# Patient Record
Sex: Male | Born: 1995 | Race: Black or African American | Hispanic: No | Marital: Single | Smoking: Never smoker
Health system: Southern US, Community
[De-identification: ages and names within clinical notes are randomized; demographics above are authoritative.]

## PROBLEM LIST (undated history)

## (undated) DIAGNOSIS — J45909 Unspecified asthma, uncomplicated: Secondary | ICD-10-CM

## (undated) DIAGNOSIS — J309 Allergic rhinitis, unspecified: Secondary | ICD-10-CM

## (undated) DIAGNOSIS — R519 Headache, unspecified: Secondary | ICD-10-CM

## (undated) DIAGNOSIS — R51 Headache: Secondary | ICD-10-CM

---
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
## 2018-12-12 ENCOUNTER — Ambulatory Visit
Admission: EM | Admit: 2018-12-12 | Discharge: 2018-12-12 | Disposition: A | Discharge status: Other Acute Inpatient Hospital | Payer: Self-pay

## 2019-12-22 ENCOUNTER — Ambulatory Visit: Payer: Self-pay | Admitting: Family Medicine

## 2020-10-22 ENCOUNTER — Ambulatory Visit
Admission: EM | Admit: 2020-10-22 | Discharge: 2020-10-22 | Disposition: A | Discharge status: Home / Self Care | Payer: BC Managed Care – PPO | Attending: Family Medicine | Admitting: Family Medicine

## 2020-10-22 ENCOUNTER — Encounter: Payer: Self-pay | Admitting: Emergency Medicine

## 2020-10-22 ENCOUNTER — Other Ambulatory Visit: Payer: Self-pay

## 2020-10-22 DIAGNOSIS — K4090 Unilateral inguinal hernia, without obstruction or gangrene, not specified as recurrent: Secondary | ICD-10-CM | POA: Insufficient documentation

## 2020-10-22 LAB — URINALYSIS, COMPLETE (UACMP) WITH MICROSCOPIC
Bacteria, UA: NONE SEEN
Bilirubin Urine: NEGATIVE
Glucose, UA: NEGATIVE mg/dL
Hgb urine dipstick: NEGATIVE
Ketones, ur: NEGATIVE mg/dL
Leukocytes,Ua: NEGATIVE
Nitrite: NEGATIVE
Protein, ur: NEGATIVE mg/dL
RBC / HPF: NONE SEEN RBC/hpf (ref 0–5)
Specific Gravity, Urine: 1.025 (ref 1.005–1.030)
Squamous Epithelial / LPF: NONE SEEN (ref 0–5)
pH: 6 (ref 5.0–8.0)

## 2020-10-22 MED ORDER — MELOXICAM 15 MG PO TABS: 15.0000 mg | ORAL_TABLET | Freq: Every day | ORAL | 0 refills | Status: DC | PRN

## 2020-10-22 NOTE — ED Provider Notes (Signed)
[facility]    CSN: [id number] Arrival date & time: 10/22/20  0859      History   Chief Complaint Chief Complaint  Patient presents with   Hip Pain   Abdominal Pain    left   HPI  25 year old male presents with the above complaints.  Patient reports that he has had left lateral hip pain for the past 2 weeks.  He states that it occurred after he was playing basketball.  Patient reports that he has applied ice With improvement.  Patient reports that over the past 2 to 3 days he has noticed some pain in his groin and has a bulge in the area.  He states that this was initially noticed by his girlfriend.  He believes he has a hernia.  He states that he lifts heavy objects at work.  He is a [employer].  Pain currently 6/10 in severity.  No relieving factors.  No nausea, vomiting,, fever.  No other complaints this time  Past Medical History:  Diagnosis Date   Allergic rhinitis    Asthma    Headache    Home Medications    Prior to Admission medications   Medication Sig Start Date End Date Taking? Authorizing Provider  meloxicam (MOBIC) 15 MG tablet Take 1 tablet (15 mg total) by mouth daily as needed. 10/22/20  Yes Shanikka Wonders G, DO  cetirizine (ZYRTEC) 10 MG tablet Take 10 mg by mouth daily.    [provider]  albuterol (PROVENTIL HFA;VENTOLIN HFA) 108 (90 BASE) MCG/ACT inhaler Inhale 2 puffs into the lungs every 4 (four) hours as needed for wheezing or shortness of breath. 06/27/15 10/22/20  Renford Dills, NP    Family History Family History  Problem Relation Age of Onset   Diabetes Mother    Diabetes Father     Social History Social History   Tobacco Use   Smoking status: Never Smoker   Smokeless tobacco: Never Used  Substance Use Topics   Alcohol use: No   Drug use: No     Allergies   Patient has no known allergies.   Review of Systems Review of Systems Per HPI Physical Exam Triage Vital Signs ED Triage Vitals  Enc  Vitals Group     BP 10/22/20 0941 136/68     Pulse Rate 10/22/20 0941 76     Resp 10/22/20 0941 18     Temp 10/22/20 0941 98.5 F (36.9 C)     Temp Source 10/22/20 0941 Oral     SpO2 10/22/20 0941 100 %     Weight 10/22/20 0938 193 lb (87.5 kg)     Height 10/22/20 0938 6\' 3"  (1.905 m)     Head Circumference --      Peak Flow --      Pain Score 10/22/20 1026 5     Pain Loc --      Pain Edu? --      Excl. in GC? --    Updated Vital Signs BP 136/68 (BP Location: Left Arm)    Pulse 76    Temp 98.5 F (36.9 C) (Oral)    Resp 18    Ht 6\' 3"  (1.905 m)    Wt 87.5 kg    SpO2 100%    BMI 24.12 kg/m   Visual Acuity Right Eye Distance:   Left Eye Distance:   Bilateral Distance:    Right Eye Near:   Left Eye Near:    Bilateral Near:  Physical Exam Vitals and nursing note reviewed.  Constitutional:      General: He is not in acute distress.    Appearance: Normal appearance. He is not ill-appearing.  HENT:     Head: Normocephalic and atraumatic.  Eyes:     General:        Right eye: No discharge.        Left eye: No discharge.     Conjunctiva/sclera: Conjunctivae normal.  Cardiovascular:     Rate and Rhythm: Normal rate and regular rhythm.     Heart sounds: No murmur heard.   Pulmonary:     Effort: Pulmonary effort is normal.     Breath sounds: No wheezing, rhonchi or rales.  Abdominal:     Hernia: A hernia is present. Hernia is present in the left inguinal area.  Genitourinary:    Comments: Left inguinal hernia noted.  Was easily reduced today. Neurological:     Mental Status: He is alert.  Psychiatric:        Mood and Affect: Mood normal.        Behavior: Behavior normal.    UC Treatments / Results  Labs (all labs ordered are listed, but only abnormal results are displayed) Labs Reviewed  URINALYSIS, COMPLETE ([facility]) WITH MICROSCOPIC    EKG   Radiology No results found.  Procedures Procedures (including critical care time)  Medications Ordered in  UC Medications - No data to display  Initial Impression / Assessment and Plan / UC Course  I have reviewed the triage vital signs and the nursing notes.  Pertinent labs & imaging results that were available during my care of the patient were reviewed by me and considered in my medical decision making (see chart for details).    25 year old male presents with a reducible left inguinal hernia.  Meloxicam as needed for pain.  Advised to avoid heavy lifting.  Work note given.  Referral placed to general surgery.  Final Clinical Impressions(s) / UC Diagnoses   Final diagnoses:  Left inguinal hernia     Discharge Instructions     No lifting > 10 lbs.  Medication as prescribed.  I have referred you to General surgery.  Take care  Dr. Adriana Simas    ED Prescriptions    Medication Sig Dispense Auth. Provider   meloxicam (MOBIC) 15 MG tablet Take 1 tablet (15 mg total) by mouth daily as needed. 30 tablet Tommie Sams, DO     PDMP not reviewed this encounter.   Tommie Sams, [place] 10/22/20 1124

## 2020-10-22 NOTE — ED Triage Notes (Signed)
Pt presents with left hip and lower left abdominal pain x 2.5 weeks. He is concerned he may have a hernia. 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 Pain 6/10. Applies Icy hot to area.

## 2020-10-22 NOTE — Discharge Instructions (Signed)
No lifting > 10 lbs.  Medication as prescribed.  I have referred you to General surgery.  Take care  Dr. Adriana Simas

## 2020-10-27 ENCOUNTER — Ambulatory Visit (INDEPENDENT_AMBULATORY_CARE_PROVIDER_SITE_OTHER): Payer: BC Managed Care – PPO | Admitting: Surgery

## 2020-10-27 ENCOUNTER — Encounter: Payer: Self-pay | Admitting: Surgery

## 2020-10-27 ENCOUNTER — Other Ambulatory Visit: Payer: Self-pay

## 2020-10-27 VITALS — BP 145/80 | HR 71 | Temp 98.3°F | Ht 75.0 in | Wt 195.2 lb

## 2020-10-27 DIAGNOSIS — K4090 Unilateral inguinal hernia, without obstruction or gangrene, not specified as recurrent: Secondary | ICD-10-CM | POA: Diagnosis not present

## 2020-10-27 NOTE — Patient Instructions (Addendum)
Our surgery scheduler Britta Mccreedy will contact you within the next 24-48 hours to discuss the preparation prior to surgery and also discuss the times to align with your schedule. Please have the BLUE sheet when she contacts you. If you have any questions or concerns, please do not hesitate to give our office a call. Dr Aleen Campi discussed with patient the surgical treatment, risk factors and recovery phase from surgery at today's visit. Patient advised to avoid any heavy lifting, bending, pushing and pulling for a total of 4-6 weeks after surgery.  Inguinal Hernia, Adult An inguinal hernia develops when fat or the intestines push through a weak spot in a muscle where the leg meets the lower abdomen (groin). This creates a bulge. This kind of hernia could also be:  In the scrotum, if you are male.  In folds of skin around the vagina, if you are male. There are three types of inguinal hernias:  Hernias that can be pushed back into the abdomen (are reducible). This type rarely causes pain.  Hernias that are not reducible (are incarcerated).  Hernias that are not reducible and lose their blood supply (are strangulated). This type of hernia requires emergency surgery. What are the causes? This condition is caused by having a weak spot in the muscles or tissues in your groin. This develops over time. The hernia may poke through the weak spot when you suddenly strain your lower abdominal muscles, such as when you:  Lift a heavy object.  Strain to have a bowel movement. Constipation can lead to straining.  Cough. What increases the risk? This condition is more likely to develop in:  Males.  Pregnant females.  People who: ? Are overweight. ? Work in jobs that require long periods of standing or heavy lifting. ? Have had an inguinal hernia before. ? Smoke or have lung disease. These factors can lead to long-term (chronic) coughing. What are the signs or symptoms? Symptoms may depend on the  size of the hernia. Often, a small inguinal hernia has no symptoms. Symptoms of a larger hernia may include:  A bulge in the groin area. This is easier to see when standing. It might not be visible when lying down.  Pain or burning in the groin. This may get worse when lifting, straining, or coughing.  A dull ache or a feeling of pressure in the groin.  An unusual bulge in the scrotum, in males. Symptoms of a strangulated inguinal hernia may include:  A bulge in your groin that is very painful and tender to the touch.  A bulge that turns red or purple.  Fever, nausea, and vomiting.  Inability to have a bowel movement or to pass gas. How is this diagnosed? This condition is diagnosed based on your symptoms, your medical history, and a physical exam. Your health care provider may feel your groin area and ask you to cough. How is this treated? Treatment depends on the size of your hernia and whether you have symptoms. If you do not have symptoms, your health care provider may have you watch your hernia carefully and have you come in for follow-up visits. If your hernia is large or if you have symptoms, you may need surgery to repair the hernia. Follow these instructions at home: Lifestyle  Avoid lifting heavy objects.  Avoid standing for long periods of time.  Do not use any products that contain nicotine or tobacco. These products include cigarettes, chewing tobacco, and vaping devices, such as e-cigarettes. If you need  help quitting, ask your health care provider.  Maintain a healthy weight. Preventing constipation You may need to take these actions to prevent or treat constipation:  Drink enough fluid to keep your urine pale yellow.  Take over-the-counter or prescription medicines.  Eat foods that are high in fiber, such as beans, whole grains, and fresh fruits and vegetables.  Limit foods that are high in fat and processed sugars, such as fried or sweet foods. General  instructions  You may try to push the hernia back in place by very gently pressing on it while lying down. Do not try to force the bulge back in if it will not push in easily.  Watch your hernia for any changes in shape, size, or color. Get help right away if you notice any changes.  Take over-the-counter and prescription medicines only as told by your health care provider.  Keep all follow-up visits. This is important. Contact a health care provider if:  You have a fever or chills.  You develop new symptoms.  Your symptoms get worse. Get help right away if:  You have pain in your groin that suddenly gets worse.  You have a bulge in your groin that: ? Suddenly gets bigger and does not get smaller. ? Becomes red or purple or painful to the touch.  You are a man and you have a sudden pain in your scrotum, or the size of your scrotum suddenly changes.  You cannot push the hernia back in place by very gently pressing on it when you are lying down.  You have nausea or vomiting that does not go away.  You have a fast heartbeat.  You cannot have a bowel movement or pass gas. These symptoms may represent a serious problem that is an emergency. Do not wait to see if the symptoms will go away. Get medical help right away. Call your local emergency services (911 in the [place] Summary  An inguinal hernia develops when fat or the intestines push through a weak spot in a muscle where your leg meets your lower abdomen (groin).  This condition is caused by having a weak spot in muscles or tissues in your groin.  Symptoms may depend on the size of the hernia, and they may include pain or swelling in your groin. A small inguinal hernia often has no symptoms.  Treatment may not be needed if you do not have symptoms. If you have symptoms or a large hernia, you may need surgery to repair the hernia.  Avoid lifting heavy objects. Also, avoid standing for long periods of time. This information  is not intended to replace advice given to you by your health care provider. Make sure you discuss any questions you have with your health care provider. Document Revised: 05/18/2020 Document Reviewed: 05/18/2020 Elsevier Patient Education  2021 [employer]  Laparoscopic Inguinal Hernia Repair, Adult Laparoscopic inguinal hernia repair is a surgical procedure to repair a small, weak spot in the groin muscles that allows fat or intestines from inside the abdomen to bulge out (inguinal hernia). This procedure may be planned, or it may be an emergency procedure. During the procedure, tissue that has bulged out is moved back into place, and the opening in the groin muscles is repaired. This is done through three small incisions in the abdomen. A thin tube with a light and camera on the end (laparoscope) is used to help perform the procedure. Tell a health care provider about:  Any allergies you have.  All medicines you are taking, including vitamins, herbs, eye drops, creams, and over-the-counter medicines.  Any problems you or family members have had with anesthetic medicines.  Any blood disorders you have.  Any surgeries you have had.  Any medical conditions you have.  Whether you are pregnant or may be pregnant. What are the risks? Generally, this is a safe procedure. However, problems may occur, including:  Infection.  Bleeding.  Allergic reactions to medicines.  Damage to nearby structures or organs.  Testicle damage or long-term pain and swelling of the scrotum, in males.  Inability to completely empty the bladder (urinary retention).  Blood clots.  A collection of fluid that builds up under the skin (seroma).  The hernia coming back (recurrence). What happens before the procedure? Staying hydrated Follow instructions from your health care provider about hydration, which may include:  Up to 2 hours before the procedure - you may continue to drink clear liquids, such  as water, clear fruit juice, black coffee, and plain tea.   Eating and drinking restrictions Follow instructions from your health care provider about eating and drinking, which may include:  8 hours before the procedure - stop eating heavy meals or foods, such as meat, fried foods, or fatty foods.  6 hours before the procedure - stop eating light meals or foods, such as toast or cereal.  6 hours before the procedure - stop drinking milk or drinks that contain milk.  2 hours before the procedure - stop drinking clear liquids. Medicines Ask your health care provider about:  Changing or stopping your regular medicines. This is especially important if you are taking diabetes medicines or blood thinners.  Taking medicines such as aspirin and ibuprofen. These medicines can thin your blood. Do not take these medicines unless your health care provider tells you to take them.  Taking over-the-counter medicines, vitamins, herbs, and supplements. General instructions  Do not use any products that contain nicotine or tobacco for at least 4 weeks before the procedure, if possible. These products include cigarettes, chewing tobacco, and vaping devices, such as e-cigarettes. If you need help quitting, ask your health care provider.  Ask your health care provider: ? How your surgery site will be marked. ? What steps will be taken to help prevent infection. These steps may include:  Removing hair at the surgery site.  Washing skin with a germ-killing soap.  Taking antibiotic medicine.  Plan to have a responsible adult take you home from the hospital or clinic.  Plan to have a responsible adult care for you for the time you are told after you leave the hospital or clinic. This is important. What happens during the procedure?  An IV will be inserted into one of your veins.  You will be given one or more of the following: ? A medicine to help you relax (sedative). ? A medicine to make you fall  asleep (general anesthetic).  Three small incisions will be made in your abdomen.  Your abdomen will be inflated with carbon dioxide gas to make the surgical area easier to see.  A laparoscope and surgical instruments will be inserted through the incisions. The laparoscope will send images of the inside of your abdomen to a monitor in the room.  Tissue that is bulging through the hernia may be removed or moved back into place.  The hernia opening will be closed with a sheet of surgical mesh.  The surgical instruments and laparoscope will be removed.  Your incisions will  be closed with stitches (sutures) and adhesive strips.  A bandage (dressing) will be placed over your incisions. The procedure may vary among health care providers and hospitals. What happens after the procedure?  Your blood pressure, heart rate, breathing rate, and blood oxygen level will be monitored until you leave the hospital or clinic.  You will be given pain medicine as needed.  You may continue to receive medicines and fluids through an IV. The IV will be removed after you can drink fluids.  You will be encouraged to get up and move around and to take deep breaths frequently.  If you were given a sedative during the procedure, it can affect you for several hours. Do not drive or operate machinery until your health care provider says that it is safe. Summary  Laparoscopic inguinal hernia repair is a surgical procedure to repair a small, weak spot in the groin muscles that allows fat or intestines from inside the abdomen to bulge out (inguinal hernia).  This procedure is done through three small incisions in the abdomen. A thin tube with a light and camera on the end (laparoscope) is used to help perform the procedure.  After the procedure, you will be encouraged to get up and move around and to take deep breaths frequently. This information is not intended to replace advice given to you by your health care  provider. Make sure you discuss any questions you have with your health care provider. Document Revised: 05/18/2020 Document Reviewed: 05/18/2020 Elsevier Patient Education  2021 [employer].

## 2020-10-27 NOTE — Progress Notes (Signed)
10/27/2020  Reason for Visit:  Left inguinal hernia  History of Present Illness: Allen Day is a 25 y.o. male presenting for evaluation of a left inguinal hernia.  The patient presented to Urgent Care on 1/21 with complaints of bulging on the left groin.  The patient reports that he does some heavy lifting at work sometimes and has noticed the discomfort in the left groin particularly when trying to push the bulge back inside.  He also notices discomfort when he's been playing basketball.  Denies any worsening pain and reports the bulging is able to be pushed inside.  Denies any troubles with urination or bowel movements, and denies any obstructive symptoms.  The discomfort is localized to the left groin and does not radiate.  Denies any issues on the right groin.  Past Medical History: Past Medical History:  Diagnosis Date  . Allergic rhinitis   . Asthma   . Headache      Past Surgical History: --No prior surgeries.  Home Medications: Prior to Admission medications   Medication Sig Start Date End Date Taking? Authorizing Provider  cetirizine (ZYRTEC) 10 MG tablet Take 10 mg by mouth daily.   Yes [provider]  meloxicam (MOBIC) 15 MG tablet Take 1 tablet (15 mg total) by mouth daily as needed. 10/22/20  Yes Cook, Jayce G, DO  albuterol (PROVENTIL HFA;VENTOLIN HFA) 108 (90 BASE) MCG/ACT inhaler Inhale 2 puffs into the lungs every 4 (four) hours as needed for wheezing or shortness of breath. 06/27/15 10/22/20  Renford Dills, NP    Allergies: No Known Allergies  Social History:  reports that he has never smoked. He has never used smokeless tobacco. He reports that he does not drink alcohol and does not use drugs.   Family History: Family History  Problem Relation Age of Onset  . Diabetes Mother   . Diabetes Father     Review of Systems: Review of Systems  Constitutional: Negative for chills and fever.  HENT: Negative for hearing loss.   Respiratory: Negative  for shortness of breath.   Cardiovascular: Negative for chest pain.  Gastrointestinal: Positive for abdominal pain (left groin discomfort.). Negative for nausea and vomiting.  Genitourinary: Negative for dysuria.  Musculoskeletal: Negative for myalgias.  Skin: Negative for rash.  Neurological: Negative for dizziness.  Psychiatric/Behavioral: Negative for depression.    Physical Exam BP (!) 145/80   Pulse 71   Temp 98.3 F (36.8 C) (Oral)   Ht 6\' 3"  (1.905 m)   Wt 195 lb 3.2 oz (88.5 kg)   SpO2 98%   BMI 24.40 kg/m  CONSTITUTIONAL: No acute distress HEENT:  Normocephalic, atraumatic, extraocular motion intact. NECK: Trachea is midline, and there is no jugular venous distension.  RESPIRATORY: Normal respiratory effort without pathologic use of accessory muscles. CARDIOVASCULAR: Regular rhythm and rate. GI: The abdomen is soft, non-distended, non-tender to palpation.  When standing up, the patient has a noticeable bulge in the left groin consistent with a left inguinal hernia, without any defects on the right groin.  When supine, the hernia easily reduces on its own.  No palpable hernia on the right or on the umbillicus.  MUSCULOSKELETAL:  Normal muscle strength and tone in all four extremities.  No peripheral edema or cyanosis. SKIN: Skin turgor is normal. There are no pathologic skin lesions.  NEUROLOGIC:  Motor and sensation is grossly normal.  Cranial nerves are grossly intact. PSYCH:  Alert and oriented to person, place and time. Affect is normal.  Laboratory Analysis: No results found for this or any previous visit (from the past 24 hour(s)).  Imaging: No results found.  Assessment and Plan: This is a 25 y.o. male with a left inguinal hernia.  --Discussed with the patient the types of inguinal hernias ranging from reducible, which is his, to incarcerated and strangulated.  Discussed that there is no conservative measure such as exercises or medications that he could take to  reduce the size of the hernia or repair it.  I think he would be a good candidate for robotic approach to hernia repair.  Discussed with him the risks of bleeding, infection, injury to surrounding structures, the ability to evaluate the right groin and repair any hernia on the right side in the same setting, the recovery time, post-op pain and mobility, and activity restrictions, and he's willing to proceed. --We will schedule him for robotic assisted left inguinal hernia repair on 11/11/20.  He understands he will need COVID-19 testing prior to surgery.  Face-to-face time spent with the patient and care providers was 45 minutes, with more than 50% of the time spent counseling, educating, and coordinating care of the patient.     Cobi Delph Luis Khamora Karan, MD [facility]   

## 2020-10-27 NOTE — H&P (View-Only) (Signed)
10/27/2020  Reason for Visit:  Left inguinal hernia  History of Present Illness: Allen Day is a 25 y.o. male presenting for evaluation of a left inguinal hernia.  The patient presented to Urgent Care on 1/21 with complaints of bulging on the left groin.  The patient reports that he does some heavy lifting at work sometimes and has noticed the discomfort in the left groin particularly when trying to push the bulge back inside.  He also notices discomfort when he's been playing basketball.  Denies any worsening pain and reports the bulging is able to be pushed inside.  Denies any troubles with urination or bowel movements, and denies any obstructive symptoms.  The discomfort is localized to the left groin and does not radiate.  Denies any issues on the right groin.  Past Medical History: Past Medical History:  Diagnosis Date  . Allergic rhinitis   . Asthma   . Headache      Past Surgical History: --No prior surgeries.  Home Medications: Prior to Admission medications   Medication Sig Start Date End Date Taking? Authorizing Provider  cetirizine (ZYRTEC) 10 MG tablet Take 10 mg by mouth daily.   Yes [provider]  meloxicam (MOBIC) 15 MG tablet Take 1 tablet (15 mg total) by mouth daily as needed. 10/22/20  Yes Cook, Jayce G, DO  albuterol (PROVENTIL HFA;VENTOLIN HFA) 108 (90 BASE) MCG/ACT inhaler Inhale 2 puffs into the lungs every 4 (four) hours as needed for wheezing or shortness of breath. 06/27/15 10/22/20  Renford Dills, NP    Allergies: No Known Allergies  Social History:  reports that he has never smoked. He has never used smokeless tobacco. He reports that he does not drink alcohol and does not use drugs.   Family History: Family History  Problem Relation Age of Onset  . Diabetes Mother   . Diabetes Father     Review of Systems: Review of Systems  Constitutional: Negative for chills and fever.  HENT: Negative for hearing loss.   Respiratory: Negative  for shortness of breath.   Cardiovascular: Negative for chest pain.  Gastrointestinal: Positive for abdominal pain (left groin discomfort.). Negative for nausea and vomiting.  Genitourinary: Negative for dysuria.  Musculoskeletal: Negative for myalgias.  Skin: Negative for rash.  Neurological: Negative for dizziness.  Psychiatric/Behavioral: Negative for depression.    Physical Exam BP (!) 145/80   Pulse 71   Temp 98.3 F (36.8 C) (Oral)   Ht 6\' 3"  (1.905 m)   Wt 195 lb 3.2 oz (88.5 kg)   SpO2 98%   BMI 24.40 kg/m  CONSTITUTIONAL: No acute distress HEENT:  Normocephalic, atraumatic, extraocular motion intact. NECK: Trachea is midline, and there is no jugular venous distension.  RESPIRATORY: Normal respiratory effort without pathologic use of accessory muscles. CARDIOVASCULAR: Regular rhythm and rate. GI: The abdomen is soft, non-distended, non-tender to palpation.  When standing up, the patient has a noticeable bulge in the left groin consistent with a left inguinal hernia, without any defects on the right groin.  When supine, the hernia easily reduces on its own.  No palpable hernia on the right or on the umbillicus.  MUSCULOSKELETAL:  Normal muscle strength and tone in all four extremities.  No peripheral edema or cyanosis. SKIN: Skin turgor is normal. There are no pathologic skin lesions.  NEUROLOGIC:  Motor and sensation is grossly normal.  Cranial nerves are grossly intact. PSYCH:  Alert and oriented to person, place and time. Affect is normal.  Laboratory Analysis: No results found for this or any previous visit (from the past 24 hour(s)).  Imaging: No results found.  Assessment and Plan: This is a 25 y.o. male with a left inguinal hernia.  --Discussed with the patient the types of inguinal hernias ranging from reducible, which is his, to incarcerated and strangulated.  Discussed that there is no conservative measure such as exercises or medications that he could take to  reduce the size of the hernia or repair it.  I think he would be a good candidate for robotic approach to hernia repair.  Discussed with him the risks of bleeding, infection, injury to surrounding structures, the ability to evaluate the right groin and repair any hernia on the right side in the same setting, the recovery time, post-op pain and mobility, and activity restrictions, and he's willing to proceed. --We will schedule him for robotic assisted left inguinal hernia repair on 11/11/20.  He understands he will need COVID-19 testing prior to surgery.  Face-to-face time spent with the patient and care providers was 45 minutes, with more than 50% of the time spent counseling, educating, and coordinating care of the patient.     Howie Ill, MD [facility]

## 2020-10-28 ENCOUNTER — Telehealth: Payer: Self-pay | Admitting: Surgery

## 2020-10-28 NOTE — Telephone Encounter (Signed)
Patient has been advised of Pre-Admission date/time, COVID Testing date and Surgery date.  Surgery Date: 11/11/20 Preadmission Testing Date: 11/02/20 (phone 8a-1p) Covid Testing Date: 11/09/20 - patient advised to [facility] ([address] [facility]) between 8a-1p   Patient has been made aware to call [phone], between 1-3:00pm the day before surgery, to find out what time to arrive for surgery.

## 2020-11-02 ENCOUNTER — Encounter
Admission: RE | Admit: 2020-11-02 | Discharge: 2020-11-02 | Disposition: A | Discharge status: Home / Self Care | Payer: BC Managed Care – PPO | Source: Ambulatory Visit | Attending: Surgery | Admitting: Surgery

## 2020-11-02 ENCOUNTER — Other Ambulatory Visit: Payer: Self-pay

## 2020-11-02 NOTE — Patient Instructions (Addendum)
Your procedure is scheduled on: Thursday, February 10 Report to the Registration Desk on the 1st floor of the [facility] To find out your arrival time, please call [phone] between 1PM - 3PM on: Wednesday, February 9  REMEMBER: Instructions that are not followed completely may result in serious medical risk, up to and including death; or upon the discretion of your surgeon and anesthesiologist your surgery may need to be rescheduled.  Do not eat food after midnight the night before surgery.  No gum chewing, lozengers or hard candies.  You may however, drink CLEAR liquids up to 2 hours before you are scheduled to arrive for your surgery. Do not drink anything within 2 hours of your scheduled arrival time.  Clear liquids include: - water  - apple juice without pulp - gatorade (not RED, PURPLE, OR BLUE) - black coffee or tea (Do NOT add milk or creamers to the coffee or tea) Do NOT drink anything that is not on this list.  DO NOT TAKE ANY MEDICATIONS THE MORNING OF SURGERY  One week prior to surgery: STARTING February 3 Stop MELOXICAM, Anti-inflammatories (NSAIDS) such as Advil, Aleve, Ibuprofen, Motrin, Naproxen, Naprosyn and Aspirin based products such as Excedrin, Goodys Powder, BC Powder. Stop ANY OVER THE COUNTER supplements until after surgery.  No Alcohol for 24 hours before or after surgery.  No Smoking including e-cigarettes for 24 hours prior to surgery.  No chewable tobacco products for at least 6 hours prior to surgery.  No nicotine patches on the day of surgery.  Do not use any "recreational" drugs for at least a week prior to your surgery.  Please be advised that the combination of cocaine and anesthesia may have negative outcomes, up to and including death. If you test positive for cocaine, your surgery will be cancelled.  On the morning of surgery brush your teeth with toothpaste and water, you may rinse your mouth with mouthwash if you wish. Do not swallow  any toothpaste or mouthwash.  Do not wear jewelry, make-up, hairpins, clips or nail polish.  Do not wear lotions, powders, or perfumes.   Do not shave body from the neck down 48 hours prior to surgery just in case you cut yourself which could leave a site for infection.  Also, freshly shaved skin may become irritated if using the CHG soap.  Contact lenses, hearing aids and dentures may not be worn into surgery.  Do not bring valuables to the hospital. [facility] is not responsible for any missing/lost belongings or valuables.   Use CHG Soap as directed on instruction sheet.  Notify your doctor if there is any change in your medical condition (cold, fever, infection).  Wear comfortable clothing (specific to your surgery type) to the hospital.  Plan for stool softeners for home use; pain medications have a tendency to cause constipation. You can also help prevent constipation by eating foods high in fiber such as fruits and vegetables and drinking plenty of fluids as your diet allows.  After surgery, you can help prevent lung complications by doing breathing exercises.  Take deep breaths and cough every 1-2 hours. Your doctor may order a device called an Incentive Spirometer to help you take deep breaths. When coughing or sneezing, hold a pillow firmly against your incision with both hands. This is called "splinting." Doing this helps protect your incision. It also decreases belly discomfort.  If you are being discharged the day of surgery, you will not be allowed to drive home. You  will need a responsible adult (18 years or older) to drive you home and stay with you that night.   If you are taking public transportation, you will need to have a responsible adult (18 years or older) with you. Please confirm with your physician that it is acceptable to use public transportation.   Please call the Pre-admissions Testing Dept. at [phone] if you have any questions about these  instructions.  Visitation Policy:  Patients undergoing a surgery or procedure may have one family member or support person with them as long as that person is not COVID-19 positive or experiencing its symptoms.  That person may remain in the waiting area during the procedure.

## 2020-11-08 ENCOUNTER — Telehealth: Payer: Self-pay | Admitting: Surgery

## 2020-11-08 NOTE — Telephone Encounter (Signed)
Pt called in an attempt to follow up on FMLA/STD leave paperwork that needs to be completed for upcoming surgery.  He states when he spoke w/someone last week, she indicated it would be done by Monday, if not sooner.  The pt is aware that there are no notations that the forms have been completed yet; however, a msg will be routed to her for verification. He is asking to be contacted @ [phone] w/a status update.  Thank you

## 2020-11-09 ENCOUNTER — Other Ambulatory Visit
Admission: RE | Admit: 2020-11-09 | Discharge: 2020-11-09 | Disposition: A | Discharge status: Home / Self Care | Payer: BC Managed Care – PPO | Source: Ambulatory Visit | Attending: Surgery | Admitting: Surgery

## 2020-11-09 ENCOUNTER — Telehealth: Payer: Self-pay | Admitting: *Deleted

## 2020-11-09 DIAGNOSIS — Z20822 Contact with and (suspected) exposure to covid-19: Secondary | ICD-10-CM | POA: Insufficient documentation

## 2020-11-09 DIAGNOSIS — Z01812 Encounter for preprocedural laboratory examination: Secondary | ICD-10-CM | POA: Insufficient documentation

## 2020-11-09 DIAGNOSIS — K4090 Unilateral inguinal hernia, without obstruction or gangrene, not specified as recurrent: Secondary | ICD-10-CM | POA: Diagnosis not present

## 2020-11-09 NOTE — Telephone Encounter (Signed)
Faxed FMLA to [employer] at [phone]

## 2020-11-09 NOTE — Telephone Encounter (Signed)
FMLA is completed and faxed, left message to let patient know.

## 2020-11-10 ENCOUNTER — Other Ambulatory Visit: Payer: Self-pay

## 2020-11-10 LAB — SARS CORONAVIRUS 2 (TAT 6-24 HRS): SARS Coronavirus 2: NEGATIVE

## 2020-11-11 ENCOUNTER — Ambulatory Visit
Admission: RE | Admit: 2020-11-11 | Discharge: 2020-11-11 | Disposition: A | Discharge status: Home / Self Care | Payer: BC Managed Care – PPO | Attending: Surgery | Admitting: Surgery

## 2020-11-11 ENCOUNTER — Ambulatory Visit: Admit: 2020-11-11 | Payer: BC Managed Care – PPO | Admitting: Surgery

## 2020-11-11 ENCOUNTER — Encounter
Admission: RE | Disposition: A | Discharge status: Home / Self Care | Payer: Self-pay | Source: Home / Self Care | Attending: Surgery

## 2020-11-11 ENCOUNTER — Ambulatory Visit: Payer: BC Managed Care – PPO | Admitting: Anesthesiology

## 2020-11-11 ENCOUNTER — Encounter: Payer: Self-pay | Admitting: Surgery

## 2020-11-11 ENCOUNTER — Ambulatory Visit: Payer: BC Managed Care – PPO

## 2020-11-11 ENCOUNTER — Other Ambulatory Visit: Payer: Self-pay

## 2020-11-11 ENCOUNTER — Other Ambulatory Visit: Payer: Self-pay | Admitting: Family Medicine

## 2020-11-11 DIAGNOSIS — Z20822 Contact with and (suspected) exposure to covid-19: Secondary | ICD-10-CM | POA: Insufficient documentation

## 2020-11-11 DIAGNOSIS — K4090 Unilateral inguinal hernia, without obstruction or gangrene, not specified as recurrent: Secondary | ICD-10-CM

## 2020-11-11 HISTORY — PX: XI ROBOTIC ASSISTED INGUINAL HERNIA REPAIR WITH MESH: SHX6706

## 2020-11-11 SURGERY — REPAIR, HERNIA, INGUINAL, ROBOT-ASSISTED, LAPAROSCOPIC, USING MESH
Anesthesia: General | Laterality: Left

## 2020-11-11 SURGERY — REPAIR, HERNIA, INGUINAL, ROBOT-ASSISTED, LAPAROSCOPIC, USING MESH
Anesthesia: General | Site: Inguinal | Laterality: Left

## 2020-11-11 MED ORDER — PROPOFOL 10 MG/ML IV BOLUS
INTRAVENOUS | Status: AC
  Filled 2020-11-11: qty 20

## 2020-11-11 MED ORDER — ACETAMINOPHEN 500 MG PO TABS: 1000.0000 mg | ORAL_TABLET | ORAL | Status: AC

## 2020-11-11 MED ORDER — KETOROLAC TROMETHAMINE 30 MG/ML IJ SOLN
INTRAMUSCULAR | Status: DC | PRN
  Administered 2020-11-11: 30 mg via INTRAVENOUS

## 2020-11-11 MED ORDER — IBUPROFEN 600 MG PO TABS: 600.0000 mg | ORAL_TABLET | Freq: Three times a day (TID) | ORAL | 1 refills | Status: DC | PRN

## 2020-11-11 MED ORDER — ACETAMINOPHEN 160 MG/5ML PO SOLN
325.0000 mg | ORAL | Status: DC | PRN
  Filled 2020-11-11: qty 20.3

## 2020-11-11 MED ORDER — HYDROCODONE-ACETAMINOPHEN 7.5-325 MG PO TABS: 1.0000 | ORAL_TABLET | Freq: Once | ORAL | Status: AC | PRN

## 2020-11-11 MED ORDER — CEFAZOLIN SODIUM-DEXTROSE 2-4 GM/100ML-% IV SOLN
INTRAVENOUS | Status: AC
  Filled 2020-11-11: qty 100

## 2020-11-11 MED ORDER — DEXAMETHASONE SODIUM PHOSPHATE 10 MG/ML IJ SOLN
INTRAMUSCULAR | Status: DC | PRN
  Administered 2020-11-11: 10 mg via INTRAVENOUS

## 2020-11-11 MED ORDER — MIDAZOLAM HCL 2 MG/2ML IJ SOLN
INTRAMUSCULAR | Status: AC
  Filled 2020-11-11: qty 2

## 2020-11-11 MED ORDER — SUGAMMADEX SODIUM 200 MG/2ML IV SOLN
INTRAVENOUS | Status: DC | PRN
  Administered 2020-11-11: 200 mg via INTRAVENOUS

## 2020-11-11 MED ORDER — IPRATROPIUM-ALBUTEROL 0.5-2.5 (3) MG/3ML IN SOLN: 3.0000 mL | RESPIRATORY_TRACT | Status: DC

## 2020-11-11 MED ORDER — HYDROCODONE-ACETAMINOPHEN 7.5-325 MG PO TABS
ORAL_TABLET | ORAL | Status: AC
  Administered 2020-11-11: 1 via ORAL
  Filled 2020-11-11: qty 1

## 2020-11-11 MED ORDER — MIDAZOLAM HCL 2 MG/2ML IJ SOLN
INTRAMUSCULAR | Status: DC | PRN
  Administered 2020-11-11: 2 mg via INTRAVENOUS

## 2020-11-11 MED ORDER — BUPIVACAINE-EPINEPHRINE (PF) 0.25% -1:200000 IJ SOLN
INTRAMUSCULAR | Status: AC
  Filled 2020-11-11: qty 30

## 2020-11-11 MED ORDER — FENTANYL CITRATE (PF) 100 MCG/2ML IJ SOLN
INTRAMUSCULAR | Status: AC
  Filled 2020-11-11: qty 2

## 2020-11-11 MED ORDER — IPRATROPIUM-ALBUTEROL 0.5-2.5 (3) MG/3ML IN SOLN
RESPIRATORY_TRACT | Status: AC
  Administered 2020-11-11: 3 mL via RESPIRATORY_TRACT
  Filled 2020-11-11: qty 3

## 2020-11-11 MED ORDER — FENTANYL CITRATE (PF) 100 MCG/2ML IJ SOLN
25.0000 ug | INTRAMUSCULAR | Status: DC | PRN
  Administered 2020-11-11 (×2): 50 ug via INTRAVENOUS

## 2020-11-11 MED ORDER — IPRATROPIUM-ALBUTEROL 0.5-2.5 (3) MG/3ML IN SOLN: 3.0000 mL | Freq: Once | RESPIRATORY_TRACT | Status: AC

## 2020-11-11 MED ORDER — GABAPENTIN 300 MG PO CAPS
ORAL_CAPSULE | ORAL | Status: AC
  Administered 2020-11-11: 300 mg via ORAL
  Filled 2020-11-11: qty 1

## 2020-11-11 MED ORDER — LACTATED RINGERS IV SOLN: INTRAVENOUS | Status: DC

## 2020-11-11 MED ORDER — FAMOTIDINE 20 MG PO TABS
ORAL_TABLET | ORAL | Status: AC
  Administered 2020-11-11: 20 mg via ORAL
  Filled 2020-11-11: qty 1

## 2020-11-11 MED ORDER — ONDANSETRON HCL 4 MG/2ML IJ SOLN
INTRAMUSCULAR | Status: DC | PRN
  Administered 2020-11-11: 4 mg via INTRAVENOUS

## 2020-11-11 MED ORDER — PROPOFOL 10 MG/ML IV BOLUS
INTRAVENOUS | Status: DC | PRN
  Administered 2020-11-11: 200 mg via INTRAVENOUS

## 2020-11-11 MED ORDER — KETOROLAC TROMETHAMINE 30 MG/ML IJ SOLN: 30.0000 mg | Freq: Once | INTRAMUSCULAR | Status: DC | PRN

## 2020-11-11 MED ORDER — SEVOFLURANE IN SOLN
RESPIRATORY_TRACT | Status: AC
  Filled 2020-11-11: qty 250

## 2020-11-11 MED ORDER — BUPIVACAINE-EPINEPHRINE 0.25% -1:200000 IJ SOLN
INTRAMUSCULAR | Status: DC | PRN
  Administered 2020-11-11: 30 mL

## 2020-11-11 MED ORDER — ACETAMINOPHEN 500 MG PO TABS
ORAL_TABLET | ORAL | Status: AC
  Administered 2020-11-11: 1000 mg via ORAL
  Filled 2020-11-11: qty 2

## 2020-11-11 MED ORDER — LIDOCAINE HCL (CARDIAC) PF 100 MG/5ML IV SOSY
PREFILLED_SYRINGE | INTRAVENOUS | Status: DC | PRN
  Administered 2020-11-11: 100 mg via INTRAVENOUS

## 2020-11-11 MED ORDER — CHLORHEXIDINE GLUCONATE 0.12 % MT SOLN
OROMUCOSAL | Status: AC
  Administered 2020-11-11: 15 mL via OROMUCOSAL
  Filled 2020-11-11: qty 15

## 2020-11-11 MED ORDER — OXYCODONE HCL 5 MG PO TABS: 5.0000 mg | ORAL_TABLET | ORAL | 0 refills | Status: DC | PRN

## 2020-11-11 MED ORDER — FENTANYL CITRATE (PF) 100 MCG/2ML IJ SOLN
INTRAMUSCULAR | Status: DC | PRN
  Administered 2020-11-11 (×2): 100 ug via INTRAVENOUS

## 2020-11-11 MED ORDER — PROMETHAZINE HCL 25 MG/ML IJ SOLN
6.2500 mg | INTRAMUSCULAR | Status: DC | PRN
  Administered 2020-11-11: 6.25 mg via INTRAVENOUS

## 2020-11-11 MED ORDER — FAMOTIDINE 20 MG PO TABS: 20.0000 mg | ORAL_TABLET | Freq: Once | ORAL | Status: AC

## 2020-11-11 MED ORDER — PROMETHAZINE HCL 25 MG/ML IJ SOLN
INTRAMUSCULAR | Status: AC
  Filled 2020-11-11: qty 1

## 2020-11-11 MED ORDER — GABAPENTIN 300 MG PO CAPS: 300.0000 mg | ORAL_CAPSULE | ORAL | Status: AC

## 2020-11-11 MED ORDER — BUPIVACAINE LIPOSOME 1.3 % IJ SUSP
INTRAMUSCULAR | Status: DC | PRN
  Administered 2020-11-11: 20 mL

## 2020-11-11 MED ORDER — ACETAMINOPHEN 325 MG PO TABS: 325.0000 mg | ORAL_TABLET | ORAL | Status: DC | PRN

## 2020-11-11 MED ORDER — SODIUM CHLORIDE FLUSH 0.9 % IV SOLN
INTRAVENOUS | Status: AC
  Filled 2020-11-11: qty 10

## 2020-11-11 MED ORDER — BUPIVACAINE LIPOSOME 1.3 % IJ SUSP
INTRAMUSCULAR | Status: AC
  Filled 2020-11-11: qty 20

## 2020-11-11 MED ORDER — CEFAZOLIN SODIUM-DEXTROSE 2-4 GM/100ML-% IV SOLN
2.0000 g | INTRAVENOUS | Status: AC
  Administered 2020-11-11: 2 g via INTRAVENOUS

## 2020-11-11 MED ORDER — BUPIVACAINE LIPOSOME 1.3 % IJ SUSP: 20.0000 mL | Freq: Once | INTRAMUSCULAR | Status: DC

## 2020-11-11 MED ORDER — ORAL CARE MOUTH RINSE: 15.0000 mL | Freq: Once | OROMUCOSAL | Status: AC

## 2020-11-11 MED ORDER — CHLORHEXIDINE GLUCONATE CLOTH 2 % EX PADS: 6.0000 | MEDICATED_PAD | Freq: Once | CUTANEOUS | Status: DC

## 2020-11-11 MED ORDER — ROCURONIUM BROMIDE 100 MG/10ML IV SOLN
INTRAVENOUS | Status: DC | PRN
  Administered 2020-11-11: 20 mg via INTRAVENOUS
  Administered 2020-11-11: 70 mg via INTRAVENOUS

## 2020-11-11 MED ORDER — CHLORHEXIDINE GLUCONATE 0.12 % MT SOLN: 15.0000 mL | Freq: Once | OROMUCOSAL | Status: AC

## 2020-11-11 MED ORDER — DROPERIDOL 2.5 MG/ML IJ SOLN
0.6250 mg | Freq: Once | INTRAMUSCULAR | Status: DC | PRN
  Filled 2020-11-11: qty 2

## 2020-11-11 MED ORDER — AMOXICILLIN-POT CLAVULANATE 875-125 MG PO TABS: 1.0000 | ORAL_TABLET | Freq: Two times a day (BID) | ORAL | 0 refills | Status: AC

## 2020-11-11 SURGICAL SUPPLY — 54 items
CANISTER SUCT 1200ML W/VALVE (MISCELLANEOUS) IMPLANT
CANNULA REDUC XI 12-8 STAPL (CANNULA) ×1
CANNULA REDUCER 12-8 DVNC XI (CANNULA) ×1 IMPLANT
CHLORAPREP W/TINT 26 (MISCELLANEOUS) ×2 IMPLANT
COVER TIP SHEARS 8 DVNC (MISCELLANEOUS) ×1 IMPLANT
COVER TIP SHEARS 8MM DA VINCI (MISCELLANEOUS) ×1
COVER WAND RF STERILE (DRAPES) IMPLANT
DEFOGGER SCOPE WARMER CLEARIFY (MISCELLANEOUS) ×2 IMPLANT
DERMABOND ADVANCED (GAUZE/BANDAGES/DRESSINGS) ×1
DERMABOND ADVANCED .7 DNX12 (GAUZE/BANDAGES/DRESSINGS) ×1 IMPLANT
DRAPE ARM DVNC X/XI (DISPOSABLE) ×4 IMPLANT
DRAPE COLUMN DVNC XI (DISPOSABLE) ×1 IMPLANT
DRAPE DA VINCI XI ARM (DISPOSABLE) ×4
DRAPE DA VINCI XI COLUMN (DISPOSABLE) ×1
ELECT CAUTERY BLADE 6.4 (BLADE) ×2 IMPLANT
ELECT REM PT RETURN 9FT ADLT (ELECTROSURGICAL) ×2
ELECTRODE REM PT RTRN 9FT ADLT (ELECTROSURGICAL) ×1 IMPLANT
GLOVE SURG SYN 7.0 (GLOVE) ×4 IMPLANT
GLOVE SURG SYN 7.5  E (GLOVE) ×2
GLOVE SURG SYN 7.5 E (GLOVE) ×2 IMPLANT
GOWN STRL REUS W/ TWL LRG LVL3 (GOWN DISPOSABLE) ×4 IMPLANT
GOWN STRL REUS W/TWL LRG LVL3 (GOWN DISPOSABLE) ×4
IRRIGATION STRYKERFLOW (MISCELLANEOUS) IMPLANT
IRRIGATOR STRYKERFLOW (MISCELLANEOUS)
IV NS 1000ML (IV SOLUTION)
IV NS 1000ML BAXH (IV SOLUTION) IMPLANT
KIT PINK PAD W/HEAD ARE REST (MISCELLANEOUS) ×2
KIT PINK PAD W/HEAD ARM REST (MISCELLANEOUS) ×1 IMPLANT
LABEL OR SOLS (LABEL) ×2 IMPLANT
MANIFOLD NEPTUNE II (INSTRUMENTS) ×2 IMPLANT
MESH 3DMAX 4X6 LT LRG (Mesh General) ×1 IMPLANT
MESH 3DMAX MID 4X6 LT LRG (Mesh General) ×1 IMPLANT
NEEDLE HYPO 22GX1.5 SAFETY (NEEDLE) ×2 IMPLANT
NEEDLE INSUFFLATION 14GA 120MM (NEEDLE) ×2 IMPLANT
OBTURATOR OPTICAL STANDARD 8MM (TROCAR) ×1
OBTURATOR OPTICAL STND 8 DVNC (TROCAR) ×1
OBTURATOR OPTICALSTD 8 DVNC (TROCAR) ×1 IMPLANT
PACK LAP CHOLECYSTECTOMY (MISCELLANEOUS) ×2 IMPLANT
PENCIL ELECTRO HAND CTR (MISCELLANEOUS) ×2 IMPLANT
SEAL CANN UNIV 5-8 DVNC XI (MISCELLANEOUS) ×3 IMPLANT
SEAL XI 5MM-8MM UNIVERSAL (MISCELLANEOUS) ×3
SET TUBE SMOKE EVAC HIGH FLOW (TUBING) ×2 IMPLANT
SOLUTION ELECTROLUBE (MISCELLANEOUS) ×2 IMPLANT
SPONGE LAP 18X18 RF (DISPOSABLE) IMPLANT
STAPLER CANNULA SEAL DVNC XI (STAPLE) ×1 IMPLANT
STAPLER CANNULA SEAL XI (STAPLE) ×1
SUT MNCRL AB 4-0 PS2 18 (SUTURE) ×2 IMPLANT
SUT VIC AB 2-0 SH 27 (SUTURE) ×2
SUT VIC AB 2-0 SH 27XBRD (SUTURE) ×2 IMPLANT
SUT VICRYL 0 AB UR-6 (SUTURE) ×4 IMPLANT
SUT VLOC 90 S/L VL9 GS22 (SUTURE) ×2 IMPLANT
TAPE TRANSPORE STRL 2 31045 (GAUZE/BANDAGES/DRESSINGS) ×2 IMPLANT
TRAY FOLEY SLVR 16FR LF STAT (SET/KITS/TRAYS/PACK) ×2 IMPLANT
TROCAR BALLN GELPORT 12X130M (ENDOMECHANICALS) ×2 IMPLANT

## 2020-11-11 NOTE — Progress Notes (Signed)
Pt has improved. Has been on room air for 45 minutes and sats have maintained in 90s.  Dr Providence Lanius and piscoya ok for pt to be discharged.

## 2020-11-11 NOTE — Transfer of Care (Addendum)
Immediate Anesthesia Transfer of Care Note  Patient: Allen Day  Procedure(s) Performed: XI ROBOTIC ASSISTED INGUINAL HERNIA REPAIR WITH MESH (Left )  Patient Location: PACU    Anesthesia Type:General  Level of Consciousness: awake, alert  and oriented  Airway & Oxygen Therapy: Patient Spontanous Breathing and Patient connected to face mask oxygen  Post-op Assessment: Report given to RN and Post -op Vital signs reviewed and stable  Post vital signs: Reviewed and stable  Last Vitals:  Vitals Value Taken Time  BP    Temp    Pulse    Resp    SpO2      Last Pain:  Vitals:   11/11/20 0748  TempSrc: Oral  PainSc: 0-No pain         Complications: No complications documented.

## 2020-11-11 NOTE — Anesthesia Postprocedure Evaluation (Signed)
Anesthesia Post Note  Patient: D Vauge J Maravilla  Procedure(s) Performed: XI ROBOTIC ASSISTED INGUINAL HERNIA REPAIR WITH MESH (Left Inguinal)  Patient location during evaluation: PACU Anesthesia Type: General Level of consciousness: awake and alert Pain management: pain level controlled Vital Signs Assessment: post-procedure vital signs reviewed and stable Respiratory status: spontaneous breathing, nonlabored ventilation and respiratory function stable Cardiovascular status: blood pressure returned to baseline and stable Postop Assessment: no apparent nausea or vomiting Anesthetic complications: no Comments: Pt showed initial signs of neg press pulm edema. Pos rales B on exam. Eventually cleared w RA sats 99-100, lungs CTA B    No complications documented.   Last Vitals:  Vitals:   11/11/20 1445 11/11/20 1500  BP: (!) 142/62 115/71  Pulse: 96 86  Resp: (!) 24 (!) 28  Temp:  36.6 C  SpO2: 98% 94%    Last Pain:  Vitals:   11/11/20 1500  TempSrc:   PainSc: 5                  Christia Reading

## 2020-11-11 NOTE — Anesthesia Procedure Notes (Signed)
Procedure Name: Intubation Performed by: Danelle Berry, CRNA Pre-anesthesia Checklist: Patient identified, Emergency Drugs available, Suction available and Patient being monitored Patient Re-evaluated:Patient Re-evaluated prior to induction Oxygen Delivery Method: Circle system utilized Preoxygenation: Pre-oxygenation with 100% oxygen Induction Type: IV induction Ventilation: Mask ventilation without difficulty Laryngoscope Size: McGraph and 3 Grade View: Grade I Tube type: Oral Tube size: 7.5 mm Number of attempts: 1 Airway Equipment and Method: Stylet and Oral airway Placement Confirmation: ETT inserted through vocal cords under direct vision,  positive ETCO2 and breath sounds checked- equal and bilateral Tube secured with: Tape Dental Injury: Teeth and Oropharynx as per pre-operative assessment

## 2020-11-11 NOTE — Anesthesia Preprocedure Evaluation (Addendum)
Anesthesia Evaluation  Patient identified by MRN, date of birth, ID band Patient awake    Reviewed: Allergy & Precautions, H&P , NPO status , reviewed documented beta blocker date and time   Airway Mallampati: I  TM Distance: >3 FB Neck ROM: full    Dental  (+) Teeth Intact   Pulmonary asthma ,  No issues w asthma x years   Pulmonary exam normal        Cardiovascular Normal cardiovascular exam     Neuro/Psych  Headaches,    GI/Hepatic neg GERD  ,  Endo/Other    Renal/GU      Musculoskeletal   Abdominal   Peds  Hematology   Anesthesia Other Findings Past Medical History: No date: Allergic rhinitis No date: Asthma     Comment:  no problem since age 25 No date: Headache No past surgical history on file. BMI    Body Mass Index: 24.37 kg/m     Reproductive/Obstetrics                           Anesthesia Physical Anesthesia Plan  ASA: II  Anesthesia Plan: General ETT   Post-op Pain Management:    Induction: Intravenous  PONV Risk Score and Plan: Ondansetron, Treatment may vary due to age or medical condition, Dexamethasone and Midazolam  Airway Management Planned: Oral ETT  Additional Equipment:   Intra-op Plan:   Post-operative Plan: Extubation in OR  Informed Consent: I have reviewed the patients History and Physical, chart, labs and discussed the procedure including the risks, benefits and alternatives for the proposed anesthesia with the patient or authorized representative who has indicated his/her understanding and acceptance.     Dental Advisory Given  Plan Discussed with: CRNA  Anesthesia Plan Comments:        Anesthesia Quick Evaluation

## 2020-11-11 NOTE — Progress Notes (Addendum)
Patient has been having sats 90-92 % mostly while on 2 L .  Tachypnea noted in mid to upper 30s.  Rhonchi bilateral. 0 Dr Providence Lanius called and breathing tx was given.  At 1345 dr Providence Lanius at bedside to assess pt.  Remains to have tachypnea and rhonchi.  Per dr Providence Lanius continue to monitor. Pt remains very drowsy; wakes to voice but falls back asleep while talking to him.

## 2020-11-11 NOTE — Interval H&P Note (Signed)
History and Physical Interval Note:  11/11/2020 8:04 AM  Allen Day  has presented today for surgery, with the diagnosis of left inguinal hernia.  The various methods of treatment have been discussed with the patient and family. After consideration of risks, benefits and other options for treatment, the patient has consented to  Procedure(s): XI ROBOTIC ASSISTED INGUINAL HERNIA REPAIR WITH MESH (Left) as a surgical intervention.  The patient's history has been reviewed, patient examined, no change in status, stable for surgery.  I have reviewed the patient's chart and labs.  Questions were answered to the patient's satisfaction.     [name]

## 2020-11-11 NOTE — Op Note (Signed)
Procedure Date:  11/11/2020  Pre-operative Diagnosis:  Left inguinal hernia  Post-operative Diagnosis: Left inguinal hernia  Procedure: 1.  Robotic assisted Left Inguinal Hernia Repair 2.  Creation of Left Posterior Rectus-Transversalis Fascia Advancment Flap for Coverage of Pelvic Wound (200 cm)  Surgeon:  Howie Ill, MD  Anesthesia:  General endotracheal  Estimated Blood Loss:  5 ml  Specimens:  None  Complications:  None  Indications for Procedure:  This is a 25 y.o. male who presents with a left inguinal hernia.  The options of surgery versus observation were reviewed with the patient and/or family. The risks of bleeding, abscess or infection, recurrence of symptoms, potential for an open procedure, injury to surrounding structures, and chronic pain were all discussed with the patient and he willing to proceed.  We have planned this transabdominal procedure with the creation of left peritoneal flap based on the posterior rectus sheath and transversalis fascia in order to fully cover the mesh, creating a natural tisssue barrier for the bowel and peritoneal cavity.  Description of Procedure: The patient was correctly identified in the preoperative area and brought into the operating room.  The patient was placed supine with VTE prophylaxis in place.  Appropriate time-outs were performed.  Anesthesia was induced and the patient was intubated.  Foley catheter was placed.  Appropriate antibiotics were infused.  The abdomen was prepped and draped in a sterile fashion. A supraumbilical incision was made. A cutdown technique was used to enter the abdominal cavity without injury, and a Hasson trocar was inserted.  Pneumoperitoneum was obtained with appropriate opening pressures.  A Veress needle was used to start dissecting the peritoneal flap.  Two 8-mm robotic ports were placed in the right and left lateral positions under direct visualization.  A large left 3D Max Mid Bard Mesh, a 2-0  Vicryl, and 2-0 vloc suture were placed through the umbilical port under direct visualization.  The [employer] platform was docked onto the patient, the camera was inserted and targeted, and the instruments were placed under direct visualization.  Both inguinal regions were inspected for hernias and it was confirmed that the patient had a left inguinal hernia.  Using electocautery, the peritoneal and posterior rectus tissue flap was created.  The peritoneum on the left side was scored from the median umbilical ligament laterally towards the ASIS.  The flap was mobilized using robotic scissors and the bipolar instruments, creating a plane along the posterior rectus sheath and transversalis fascia down to the pubic tubercle medially. It was then further mobilized laterally across the inguinal canal and femoral vessels and onto the psoas muscle. The inferior epigastric vessels were identified and preserved. This created a posterior rectus and peritoneal flap measuring roughly 17 cm x 12 cm.  The hernia sac and contents were reduced preserving all structures.  A left large Bard 3D Max Mid mesh was placed with good overlap along all the potential hernia defects and secured in place with 2-0 Vicryl along the medial superomedial and superolateral aspects.  Then, the peritoneal flap was advanced over the mesh and carried over to close the defect. A running 2-0 V lock suture was used to approximate the edge of the flap onto the peritoneum.  All needles were removed under direct visualization.  The 8- mm ports were removed under direct visualization and the Hasson trocar was removed.  The fascial opening was closed using 0 vicryl suture.  Local anesthetic was infused in all incisions as well as a left ilioinguinal  block, and the incisions were closed with 4-0 Monocryl.  The wounds were cleaned and sealed with DermaBond.  Foley catheter was removed and the patient was emerged from anesthesia and extubated and brought to  the recovery room for further management.  The patient tolerated the procedure well and all counts were correct at the end of the case.   Howie Ill, MD

## 2020-11-12 ENCOUNTER — Encounter: Payer: Self-pay | Admitting: Surgery

## 2020-11-25 ENCOUNTER — Encounter: Payer: BC Managed Care – PPO | Admitting: Physician Assistant

## 2020-12-02 ENCOUNTER — Encounter: Payer: BC Managed Care – PPO | Admitting: Physician Assistant

## 2020-12-03 ENCOUNTER — Encounter: Payer: Self-pay | Admitting: Surgery

## 2020-12-03 ENCOUNTER — Ambulatory Visit (INDEPENDENT_AMBULATORY_CARE_PROVIDER_SITE_OTHER): Payer: BC Managed Care – PPO | Admitting: Surgery

## 2020-12-03 ENCOUNTER — Other Ambulatory Visit: Payer: Self-pay

## 2020-12-03 VITALS — BP 116/77 | HR 69 | Temp 98.3°F | Ht 75.0 in | Wt 200.0 lb

## 2020-12-03 DIAGNOSIS — Z09 Encounter for follow-up examination after completed treatment for conditions other than malignant neoplasm: Secondary | ICD-10-CM

## 2020-12-03 DIAGNOSIS — K4090 Unilateral inguinal hernia, without obstruction or gangrene, not specified as recurrent: Secondary | ICD-10-CM

## 2020-12-03 NOTE — Patient Instructions (Addendum)
The discomfort in the testicle should improve in the next few weeks. If it does not please call and let us know. Follow-up with our office as needed.  Please call and ask to speak with a nurse if you develop questions or concerns.   GENERAL POST-OPERATIVE PATIENT INSTRUCTIONS   WOUND CARE INSTRUCTIONS:  If the wound becomes bright red and painful or starts to drain infected material that is not clear, please contact your physician immediately.  If the wound is mildly pink and has a thick firm ridge underneath it, this is normal, and is referred to as a healing ridge.  This will resolve over the next 4-6 weeks.  BATHING: You may shower if you have been informed of this by your surgeon. However, Please do not submerge in a tub, hot tub, or pool until incisions are completely sealed or have been told by your surgeon that you may do so.  DIET:  You may eat any foods that you can tolerate.  It is a good idea to eat a high fiber diet and take in plenty of fluids to prevent constipation.  If you do become constipated you may want to take a mild laxative or take ducolax tablets on a daily basis until your bowel habits are regular.  Constipation can be very uncomfortable, along with straining, after recent surgery.  ACTIVITY:  You are encouraged to walk and engage in light activity for the next two weeks.  You should not lift more than 20 pounds for 6 weeks after surgery as it could put you at increased risk for complications.  Twenty pounds is roughly equivalent to a plastic bag of groceries. At that time- Listen to your body when lifting, if you have pain when lifting, stop and then try again in a few days. Soreness after doing exercises or activities of daily living is normal as you get back in to your normal routine.  MEDICATIONS:  Try to take narcotic medications and anti-inflammatory medications, such as tylenol, ibuprofen, naprosyn, etc., with food.  This will minimize stomach upset from the  medication.  Should you develop nausea and vomiting from the pain medication, or develop a rash, please discontinue the medication and contact your physician.  You should not drive, make important decisions, or operate machinery when taking narcotic pain medication.  SUNBLOCK Use sun block to incision area over the next year if this area will be exposed to sun. This helps decrease scarring and will allow you avoid a permanent darkened area over your incision. May rub Vitamin-E oil or other emmolient agent in area 2-3 times a day to soften.   QUESTIONS:  Please feel free to call our office if you have any questions, and we will be glad to assist you.

## 2020-12-03 NOTE — Progress Notes (Signed)
12/03/2020  HPI: Allen SMOKEY Day is a 25 y.o. male s/p robotic assisted left inguinal hernia repair on 11/11/20.  He presents for follow up.  Reports that the only issue he's been having is some discomfort towards the left testicle.    Vital signs: BP 116/77   Pulse 69   Temp 98.3 F (36.8 C)   Ht 6\' 3"  (1.905 m)   Wt 200 lb (90.7 kg)   SpO2 97%   BMI 25.00 kg/m    Physical Exam: Constitutional:  No acute distress Abdomen:  Soft, non-distended, non-tender to palpation.  Wounds are healing well, clean, dry, intact.  No evidence of hernia recurrence. GU:  There is some discomfort at the left epididymis, but there is no palpable fluid, or swelling, or size change.  Testicles appear same size as well as both epididymis.  Assessment/Plan: This is a 25 y.o. male s/p robotic assisted left inguinal hernia repair.  --Discussed with the patient that the discomfort may be related to manipulation during surgery and irritation. This should improve with time as the inflammation settles down.  However, if there's no improvement over the next two weeks, he should contact 30 for further evaluation. --Discussed with patient activity restriction for 6 weeks given his line of work.  Will give work note today. --Follow up prn.   [place], MD [facility]

## 2021-02-26 ENCOUNTER — Other Ambulatory Visit: Payer: Self-pay

## 2021-02-26 ENCOUNTER — Emergency Department: Payer: BC Managed Care – PPO

## 2021-02-26 ENCOUNTER — Emergency Department
Admission: EM | Admit: 2021-02-26 | Discharge: 2021-02-26 | Disposition: A | Discharge status: Home / Self Care | Payer: BC Managed Care – PPO | Attending: Emergency Medicine | Admitting: Emergency Medicine

## 2021-02-26 DIAGNOSIS — R300 Dysuria: Secondary | ICD-10-CM | POA: Insufficient documentation

## 2021-02-26 DIAGNOSIS — R1030 Lower abdominal pain, unspecified: Secondary | ICD-10-CM | POA: Insufficient documentation

## 2021-02-26 DIAGNOSIS — N50812 Left testicular pain: Secondary | ICD-10-CM | POA: Insufficient documentation

## 2021-02-26 DIAGNOSIS — N5082 Scrotal pain: Secondary | ICD-10-CM | POA: Insufficient documentation

## 2021-02-26 DIAGNOSIS — N50819 Testicular pain, unspecified: Secondary | ICD-10-CM

## 2021-02-26 DIAGNOSIS — J45909 Unspecified asthma, uncomplicated: Secondary | ICD-10-CM | POA: Insufficient documentation

## 2021-02-26 LAB — CBC
HCT: 45.5 % (ref 39.0–52.0)
Hemoglobin: 15.5 g/dL (ref 13.0–17.0)
MCH: 28.4 pg (ref 26.0–34.0)
MCHC: 34.1 g/dL (ref 30.0–36.0)
MCV: 83.5 fL (ref 80.0–100.0)
Platelets: 245 10*3/uL (ref 150–400)
RBC: 5.45 MIL/uL (ref 4.22–5.81)
RDW: 13.1 % (ref 11.5–15.5)
WBC: 4 10*3/uL (ref 4.0–10.5)
nRBC: 0 % (ref 0.0–0.2)

## 2021-02-26 LAB — CHLAMYDIA/NGC RT PCR (ARMC ONLY)
Chlamydia Tr: NOT DETECTED
N gonorrhoeae: NOT DETECTED

## 2021-02-26 LAB — COMPREHENSIVE METABOLIC PANEL
ALT: 14 U/L (ref 0–44)
AST: 19 U/L (ref 15–41)
Albumin: 4.7 g/dL (ref 3.5–5.0)
Alkaline Phosphatase: 77 U/L (ref 38–126)
Anion gap: 9 (ref 5–15)
BUN: 13 mg/dL (ref 6–20)
CO2: 23 mmol/L (ref 22–32)
Calcium: 9.7 mg/dL (ref 8.9–10.3)
Chloride: 105 mmol/L (ref 98–111)
Creatinine, Ser: 0.9 mg/dL (ref 0.61–1.24)
GFR, Estimated: >60 mL/min (ref >60)
Glucose, Bld: 94 mg/dL (ref 70–99)
Potassium: 4.1 mmol/L (ref 3.5–5.1)
Sodium: 137 mmol/L (ref 135–145)
Total Bilirubin: 1.2 mg/dL (ref 0.3–1.2)
Total Protein: 7.9 g/dL (ref 6.5–8.1)

## 2021-02-26 LAB — URINALYSIS, COMPLETE (UACMP) WITH MICROSCOPIC
Bacteria, UA: NONE SEEN
Bilirubin Urine: NEGATIVE
Glucose, UA: NEGATIVE mg/dL
Hgb urine dipstick: NEGATIVE
Ketones, ur: NEGATIVE mg/dL
Leukocytes,Ua: NEGATIVE
Nitrite: NEGATIVE
Protein, ur: NEGATIVE mg/dL
Specific Gravity, Urine: 1.028 (ref 1.005–1.030)
pH: 5 (ref 5.0–8.0)

## 2021-02-26 LAB — LIPASE, BLOOD: Lipase: 27 U/L (ref 11–51)

## 2021-02-26 NOTE — ED Provider Notes (Signed)
[facility] Emergency Department Provider Note  ____________________________________________   Event Date/Time   First MD Initiated Contact with Patient 02/26/21 1242     (approximate)  I have reviewed the triage vital signs and the nursing notes.   HISTORY  Chief Complaint Abdominal Pain and Testicle Pain    HPI Allen Day is a 25 y.o. male presents emergency department complaining of testicular and lower abdominal pain along with dysuria.  Patient denies any discharge or blood in the urine.  No history of kidney stones.  Patient states he has unprotected sex with his girlfriend.  He denies any fever or chills    Past Medical History:  Diagnosis Date  . Allergic rhinitis   . Asthma    no problem since age 44  . Headache     Patient Active Problem List   Diagnosis Date Noted  . Left inguinal hernia     Past Surgical History:  Procedure Laterality Date  . XI ROBOTIC ASSISTED INGUINAL HERNIA REPAIR WITH MESH Left 11/11/2020   Procedure: XI ROBOTIC ASSISTED INGUINAL HERNIA REPAIR WITH MESH;  Surgeon: Henrene Dodge, MD;  Location: [facility] ORS;  Service: General;  Laterality: Left;    Prior to Admission medications   Medication Sig Start Date End Date Taking? Authorizing Provider  ibuprofen (ADVIL) 600 MG tablet Take 1 tablet (600 mg total) by mouth every 8 (eight) hours as needed. 11/11/20   Henrene Dodge, MD  meloxicam (MOBIC) 15 MG tablet Take 1 tablet (15 mg total) by mouth daily as needed. Patient taking differently: Take 15 mg by mouth daily. 10/22/20   Tommie Sams, DO  albuterol (PROVENTIL HFA;VENTOLIN HFA) 108 (90 BASE) MCG/ACT inhaler Inhale 2 puffs into the lungs every 4 (four) hours as needed for wheezing or shortness of breath. 06/27/15 10/22/20  Renford Dills, NP    Allergies Patient has no known allergies.  Family History  Problem Relation Age of Onset  . Diabetes Mother     Social History Social History   Tobacco Use  .  Smoking status: Never Smoker  . Smokeless tobacco: Never Used  Vaping Use  . Vaping Use: Never used  Substance Use Topics  . Alcohol use: Yes    Comment: occassional  . Drug use: No    Review of Systems  Constitutional: No fever/chills Eyes: No visual changes. ENT: No sore throat. Respiratory: Denies cough Cardiovascular: Denies chest pain Gastrointestinal: Denies abdominal pain Genitourinary: Positive for dysuria. Musculoskeletal: Negative for back pain. Skin: Negative for rash. Psychiatric: no mood changes,     ____________________________________________   PHYSICAL EXAM:  VITAL SIGNS: ED Triage Vitals  Enc Vitals Group     BP 02/26/21 1148 140/71     Pulse Rate 02/26/21 1148 78     Resp 02/26/21 1148 18     Temp 02/26/21 1148 98.2 F (36.8 C)     Temp Source 02/26/21 1148 Oral     SpO2 02/26/21 1148 97 %     Weight 02/26/21 1150 201 lb (91.2 kg)     Height 02/26/21 1150 6\' 3"  (1.905 m)     Head Circumference --      Peak Flow --      Pain Score 02/26/21 1149 5     Pain Loc --      Pain Edu? --      Excl. in GC? --     Constitutional: Alert and oriented. Well appearing and in no acute distress. Eyes: Conjunctivae are  normal.  Head: Atraumatic. Nose: No congestion/rhinnorhea. Mouth/Throat: Mucous membranes are moist.   Neck:  supple no lymphadenopathy noted Cardiovascular: Normal rate, regular rhythm. Heart sounds are normal Respiratory: Normal respiratory effort.  No retractions, lungs c t a  Abd: soft nontender bs normal all 4 quad GU: Left testicle is tender, no herpetic lesions or drainage noted Musculoskeletal: FROM all extremities, warm and well perfused Neurologic:  Normal speech and language.  Skin:  Skin is warm, dry and intact. No rash noted. Psychiatric: Mood and affect are normal. Speech and behavior are normal.  ____________________________________________   LABS (all labs ordered are listed, but only abnormal results are  displayed)  Labs Reviewed  URINALYSIS, COMPLETE (UACMP) WITH MICROSCOPIC - Abnormal; Notable for the following components:      Result Value   Color, Urine YELLOW (*)    APPearance CLEAR (*)    All other components within normal limits  CHLAMYDIA/NGC RT PCR ([facility] ONLY)  LIPASE, BLOOD  COMPREHENSIVE METABOLIC PANEL  CBC   ____________________________________________   ____________________________________________  RADIOLOGY  Ultrasound scrotum  ____________________________________________   PROCEDURES  Procedure(s) performed: No  Procedures    ____________________________________________   INITIAL IMPRESSION / ASSESSMENT AND PLAN / ED COURSE  Pertinent labs & imaging results that were available during my care of the patient were reviewed by me and considered in my medical decision making (see chart for details).   Patient is 25 year old male presents with left testicle and scrotal pain.  See HPI.  Physical exam shows patient per stable  DDx: STD, UTI, torsion, epididymitis  Labs are reassuring, CBC, metabolic panel, lipase, urinalysis are all normal GC/Chlamydia are still pending   [place] reviewed by me does not show torsion or epididymitis  Patient opts to not have treatment prior to having GC/chlamydia results.  Explained to him he follow-up with health department if they are positive.  Return emergency department if needed.  He is to find his results on [facility] MyChart.  Is discharged stable condition.  Allen Day was evaluated in Emergency Department on 02/26/2021 for the symptoms described in the history of present illness. He was evaluated in the context of the global COVID-19 pandemic, which necessitated consideration that the patient might be at risk for infection with the SARS-CoV-2 virus that causes COVID-19. Institutional protocols and algorithms that pertain to the evaluation of patients at risk for COVID-19 are in a state of rapid change  based on information released by regulatory bodies including the CDC and federal and state organizations. These policies and algorithms were followed during the patient's care in the ED.    As part of my medical decision making, I reviewed the following data within the electronic MEDICAL RECORD NUMBER Nursing notes reviewed and incorporated, Labs reviewed , Old chart reviewed, Radiograph reviewed , Notes from prior ED visits and [place] Controlled Substance Database  ____________________________________________   FINAL CLINICAL IMPRESSION(S) / ED DIAGNOSES  Final diagnoses:  Dysuria      NEW MEDICATIONS STARTED DURING THIS VISIT:  New Prescriptions   No medications on file     Note:  This document was prepared using Dragon voice recognition software and may include unintentional dictation errors.    Faythe Ghee, PA-C 02/26/21 1525    Jene Every, MD 02/27/21 [phone]

## 2021-02-26 NOTE — ED Notes (Signed)
Patient shown how to access myChart for results. Patient discharged to lobby.

## 2021-02-26 NOTE — Discharge Instructions (Addendum)
Follow-up with your regular doctor as needed.  Follow-up with [facility] department if your STD testing returns is positive.  Sign up for [facility] MyChart so that you can see your results.

## 2021-02-26 NOTE — ED Triage Notes (Signed)
Pt states he started having testicle and lower abdominal pain this morning with burning with urination- pt denies any discharge and blood in the urine

## 2021-05-30 ENCOUNTER — Other Ambulatory Visit: Payer: Self-pay

## 2021-05-30 ENCOUNTER — Emergency Department
Admission: EM | Admit: 2021-05-30 | Discharge: 2021-05-30 | Disposition: A | Discharge status: Home / Self Care | Payer: BC Managed Care – PPO | Attending: Emergency Medicine | Admitting: Emergency Medicine

## 2021-05-30 DIAGNOSIS — J45909 Unspecified asthma, uncomplicated: Secondary | ICD-10-CM | POA: Insufficient documentation

## 2021-05-30 DIAGNOSIS — R1030 Lower abdominal pain, unspecified: Secondary | ICD-10-CM | POA: Insufficient documentation

## 2021-05-30 DIAGNOSIS — R102 Pelvic and perineal pain: Secondary | ICD-10-CM | POA: Insufficient documentation

## 2021-05-30 LAB — CHLAMYDIA/NGC RT PCR (ARMC ONLY)
Chlamydia Tr: NOT DETECTED
N gonorrhoeae: NOT DETECTED

## 2021-05-30 LAB — URINALYSIS, ROUTINE W REFLEX MICROSCOPIC
Bilirubin Urine: NEGATIVE
Glucose, UA: NEGATIVE mg/dL
Hgb urine dipstick: NEGATIVE
Ketones, ur: 5 mg/dL — AB
Leukocytes,Ua: NEGATIVE
Nitrite: NEGATIVE
Protein, ur: NEGATIVE mg/dL
Specific Gravity, Urine: 1.032 — ABNORMAL HIGH (ref 1.005–1.030)
pH: 5 (ref 5.0–8.0)

## 2021-05-30 NOTE — ED Notes (Signed)
See triage note  Presents with pelvic/lower abd pressure  sxs' started couple of days ago  Denies any fever or blood in urine or penile discharge

## 2021-05-30 NOTE — ED Triage Notes (Addendum)
Pt comes with c/o pelvic pain. Pt states pain and pressure in area that started on Saturday. Pt denies any discharge or odor. Pt does states more urine frequency.

## 2021-05-30 NOTE — ED Provider Notes (Signed)
[facility] Emergency Department Provider Note   ____________________________________________   Event Date/Time   First MD Initiated Contact with Patient 05/30/21 1033     (approximate)  I have reviewed the triage vital signs and the nursing notes.   HISTORY  Chief Complaint Pelvic Pain    HPI Allen Day is a 25 y.o. male who presents for pelvic pain  LOCATION: Suprapubic region DURATION: 3 days prior to arrival TIMING: Stable since onset SEVERITY: 4/10 QUALITY: Pressure, aching CONTEXT: Patient states that he is normally sexually active with his girlfriend and use barrier protection however they had taken some time off due to girlfriend being diagnosed with PID and when he started having sex again he began noticing pelvic pressure MODIFYING FACTORS: States is partially relieved with urination and denies any exacerbating factors ASSOCIATED SYMPTOMS: Denies any dysuria or foul odor   Per medical record review, patient has no pertinent past medical history          Past Medical History:  Diagnosis Date   Allergic rhinitis    Asthma    no problem since age 8   Headache     Patient Active Problem List   Diagnosis Date Noted   Left inguinal hernia     Past Surgical History:  Procedure Laterality Date   XI ROBOTIC ASSISTED INGUINAL HERNIA REPAIR WITH MESH Left 11/11/2020   Procedure: XI ROBOTIC ASSISTED INGUINAL HERNIA REPAIR WITH MESH;  Surgeon: Henrene Dodge, MD;  Location: [facility] ORS;  Service: General;  Laterality: Left;    Prior to Admission medications   Medication Sig Start Date End Date Taking? Authorizing Provider  albuterol (PROVENTIL HFA;VENTOLIN HFA) 108 (90 BASE) MCG/ACT inhaler Inhale 2 puffs into the lungs every 4 (four) hours as needed for wheezing or shortness of breath. 06/27/15 10/22/20  Renford Dills, NP    Allergies Patient has no known allergies.  Family History  Problem Relation Age of Onset   Diabetes  Mother     Social History Social History   Tobacco Use   Smoking status: Never   Smokeless tobacco: Never  Vaping Use   Vaping Use: Never used  Substance Use Topics   Alcohol use: Yes    Comment: occassional   Drug use: No    Review of Systems Constitutional: No fever/chills Eyes: No visual changes. ENT: No sore throat. Cardiovascular: Denies chest pain. Respiratory: Denies shortness of breath. Gastrointestinal: endorses suprapubic abdominal pain.  No nausea, no vomiting.  No diarrhea. Genitourinary: Negative for dysuria. Musculoskeletal: Negative for acute arthralgias Skin: Negative for rash. Neurological: Negative for headaches, weakness/numbness/paresthesias in any extremity Psychiatric: Negative for suicidal ideation/homicidal ideation   ____________________________________________   PHYSICAL EXAM:  VITAL SIGNS: ED Triage Vitals  Enc Vitals Group     BP 05/30/21 1012 127/82     Pulse Rate 05/30/21 1012 69     Resp 05/30/21 1012 18     Temp 05/30/21 1012 98.3 F (36.8 C)     Temp src --      SpO2 05/30/21 1012 98 %     Weight 05/30/21 1013 195 lb (88.5 kg)     Height 05/30/21 1013 6\' 3"  (1.905 m)     Head Circumference --      Peak Flow --      Pain Score --      Pain Loc --      Pain Edu? --      Excl. in GC? --    Constitutional: Alert  and oriented. Well appearing and in no acute distress. Eyes: Conjunctivae are normal. PERRL. Head: Atraumatic. Nose: No congestion/rhinnorhea. Mouth/Throat: Mucous membranes are moist. Neck: No stridor Cardiovascular: Grossly normal heart sounds.  Good peripheral circulation. Respiratory: Normal respiratory effort.  No retractions. Gastrointestinal: Soft and nontender. No distention. Musculoskeletal: No obvious deformities Neurologic:  Normal speech and language. No gross focal neurologic deficits are appreciated. Skin:  Skin is warm and dry. No rash noted. Psychiatric: Mood and affect are normal. Speech and  behavior are normal.  ____________________________________________   LABS (all labs ordered are listed, but only abnormal results are displayed)  Labs Reviewed  URINALYSIS, ROUTINE W REFLEX MICROSCOPIC - Abnormal; Notable for the following components:      Result Value   Color, Urine YELLOW (*)    APPearance CLEAR (*)    Specific Gravity, Urine 1.032 (*)    Ketones, ur 5 (*)    All other components within normal limits  CHLAMYDIA/NGC RT PCR ([facility] ONLY)              PROCEDURES  Procedure(s) performed (including Critical Care):  .1-3 Lead EKG Interpretation  Date/Time: 05/30/2021 12:19 PM Performed by: Merwyn Katos, MD Authorized by: Merwyn Katos, MD     ____________________________________________   INITIAL IMPRESSION / ASSESSMENT AND PLAN / ED COURSE  As part of my medical decision making, I reviewed the following data within the electronic medical record, if available:  Nursing notes reviewed and incorporated, Labs reviewed, EKG interpreted, Old chart reviewed, Radiograph reviewed and Notes from prior ED visits reviewed and incorporated        Patient presents for suprapubic abdominal pain.  Differential diagnosis includes appendicitis, abdominal aortic aneurysm, surgical biliary disease, pancreatitis, SBO, mesenteric ischemia, serious intra-abdominal bacterial illness, genital torsion. Doubt atypical ACS. Based on history, physical exam, radiologic/laboratory evaluation, there is no red flag results or symptomatology requiring emergent intervention or need for admission at this time Pt tolerating PO. Disposition: Patient will be discharged with strict return precautions and follow up with primary MD within 12-24 hours for further evaluation. Patient understands that this still may have an early presentation of an emergent medical condition such as appendicitis that will require a recheck.      ____________________________________________   FINAL CLINICAL  IMPRESSION(S) / ED DIAGNOSES  Final diagnoses:  Pelvic pain     ED Discharge Orders     None        Note:  This document was prepared using Dragon voice recognition software and may include unintentional dictation errors.    Merwyn Katos, MD 05/30/21 [phone]

## 2021-09-22 DIAGNOSIS — Z7952 Long term (current) use of systemic steroids: Secondary | ICD-10-CM | POA: Diagnosis not present

## 2021-09-22 DIAGNOSIS — I861 Scrotal varices: Secondary | ICD-10-CM | POA: Diagnosis not present

## 2021-09-22 DIAGNOSIS — J45909 Unspecified asthma, uncomplicated: Secondary | ICD-10-CM | POA: Diagnosis not present

## 2021-09-22 DIAGNOSIS — N5082 Scrotal pain: Secondary | ICD-10-CM | POA: Insufficient documentation

## 2021-09-22 NOTE — ED Triage Notes (Signed)
Pt arrives with complaints of groin pain that started two days ago following intercourse with his significant other. Denies pain with urination or drainage.

## 2021-09-23 ENCOUNTER — Emergency Department: Payer: BC Managed Care – PPO

## 2021-09-23 ENCOUNTER — Emergency Department
Admission: EM | Admit: 2021-09-23 | Discharge: 2021-09-23 | Disposition: A | Discharge status: Home / Self Care | Payer: BC Managed Care – PPO | Attending: Emergency Medicine | Admitting: Emergency Medicine

## 2021-09-23 DIAGNOSIS — I861 Scrotal varices: Secondary | ICD-10-CM

## 2021-09-23 DIAGNOSIS — N5082 Scrotal pain: Secondary | ICD-10-CM

## 2021-09-23 LAB — URINALYSIS, ROUTINE W REFLEX MICROSCOPIC
Bilirubin Urine: NEGATIVE
Glucose, UA: NEGATIVE mg/dL
Hgb urine dipstick: NEGATIVE
Ketones, ur: NEGATIVE mg/dL
Leukocytes,Ua: NEGATIVE
Nitrite: NEGATIVE
Protein, ur: NEGATIVE mg/dL
Specific Gravity, Urine: 1.02 (ref 1.005–1.030)
pH: 7.5 (ref 5.0–8.0)

## 2021-09-23 LAB — CHLAMYDIA/NGC RT PCR (ARMC ONLY)
Chlamydia Tr: NOT DETECTED
N gonorrhoeae: NOT DETECTED

## 2021-09-23 NOTE — ED Notes (Signed)
E-signature pad unavailable - Pt verbalized understanding of D/C information - no additional concerns at this time.  

## 2021-09-23 NOTE — Discharge Instructions (Addendum)
You may alternate Tylenol 1000 mg every 6 hours as needed for pain, fever and Ibuprofen 800 mg every 8 hours as needed for pain, fever.  Please take Ibuprofen with food.  Do not take more than 4000 mg of Tylenol (acetaminophen) in a 24 hour period.  Your urine showed no sign of infection or blood.  Your gonorrhea and Chlamydia test were negative.  I suspect that your pain was due to traumatic injury from sexual intercourse and using constriction around your scrotum.  Your ultrasound showed normal blood flow to both ovaries but it did show a small left varicocele which is an incidental finding and is not due to trauma or causing your pain today.

## 2021-09-23 NOTE — ED Provider Notes (Signed)
[facility] Emergency Department Provider Note  ____________________________________________   Event Date/Time   First MD Initiated Contact with Patient 09/23/21 [phone]     (approximate)  I have reviewed the triage vital signs and the nursing notes.   HISTORY  Chief Complaint Groin Pain    HPI Allen Day is a 25 y.o. male with no significant past medical history who presents to the emergency department with scrotal pain that started after having sexual intercourse with his male partner yesterday.  States they were experimenting with sex toys and he put a elastic ring around his scrotum.  He states that it was difficult to put on and he did have some discomfort with ejaculation.  He states afterwards he has been sore in this area but no bruising or swelling.  He wanted to make sure that everything was okay.  He denies any hematuria.  No pain with urination.  He has had a normal stream.  He also wanted to be checked for STDs today.  No other injury to his groin/genital area.        Past Medical History:  Diagnosis Date   Allergic rhinitis    Asthma    no problem since age 60   Headache     Patient Active Problem List   Diagnosis Date Noted   Left inguinal hernia     Past Surgical History:  Procedure Laterality Date   XI ROBOTIC ASSISTED INGUINAL HERNIA REPAIR WITH MESH Left 11/11/2020   Procedure: XI ROBOTIC ASSISTED INGUINAL HERNIA REPAIR WITH MESH;  Surgeon: Olean Ree, MD;  Location: [facility] ORS;  Service: General;  Laterality: Left;    Prior to Admission medications   Medication Sig Start Date End Date Taking? Authorizing Provider  albuterol (PROVENTIL HFA;VENTOLIN HFA) 108 (90 BASE) MCG/ACT inhaler Inhale 2 puffs into the lungs every 4 (four) hours as needed for wheezing or shortness of breath. 06/27/15 10/22/20  Marylene Land, NP    Allergies Patient has no known allergies.  Family History  Problem Relation Age of Onset   Diabetes  Mother     Social History Social History   Tobacco Use   Smoking status: Never   Smokeless tobacco: Never  Vaping Use   Vaping Use: Never used  Substance Use Topics   Alcohol use: Yes    Comment: occassional   Drug use: No    Review of Systems Constitutional: No fever. Eyes: No visual changes. ENT: No sore throat. Cardiovascular: Denies chest pain. Respiratory: Denies shortness of breath. Gastrointestinal: No nausea, vomiting, diarrhea. Genitourinary: Negative for dysuria. Musculoskeletal: Negative for back pain. Skin: Negative for rash. Neurological: Negative for focal weakness or numbness.  ____________________________________________   PHYSICAL EXAM:  VITAL SIGNS: ED Triage Vitals  Enc Vitals Group     BP 09/22/21 2346 (!) 142/74     Pulse Rate 09/22/21 2346 65     Resp 09/22/21 2346 18     Temp 09/22/21 2346 98 F (36.7 C)     Temp Source 09/22/21 2346 Oral     SpO2 09/22/21 2346 98 %     Weight 09/22/21 2343 205 lb (93 kg)     Height 09/22/21 2343 6\' 4"  (1.93 m)     Head Circumference --      Peak Flow --      Pain Score 09/22/21 2345 0     Pain Loc --      Pain Edu? --      Excl. in [place]? --  CONSTITUTIONAL: Alert and oriented and responds appropriately to questions. Well-appearing; well-nourished HEAD: Normocephalic EYES: Conjunctivae clear, pupils appear equal, EOM appear intact ENT: normal nose; moist mucous membranes NECK: Supple, normal ROM CARD: RRR; S1 and S2 appreciated; no murmurs, no clicks, no rubs, no gallops RESP: Normal chest excursion without splinting or tachypnea; breath sounds clear and equal bilaterally; no wheezes, no rhonchi, no rales, no hypoxia or respiratory distress, speaking full sentences ABD/GI: Normal bowel sounds; non-distended; soft, non-tender, no rebound, no guarding, no peritoneal signs, no hepatosplenomegaly GU:  Normal external genitalia, circumcised male, normal penile shaft, no blood or discharge at the urethral  meatus, no testicular masses or tenderness on exam, no scrotal masses or swelling, no hernias appreciated, 2+ femoral pulses bilaterally; no perineal erythema, warmth, subcutaneous air or crepitus; no high riding testicle, normal bilateral cremasteric reflex.  Chaperone present for exam. BACK: The back appears normal EXT: Normal ROM in all joints; no deformity noted, no edema; no cyanosis SKIN: Normal color for age and race; warm; no rash on exposed skin NEURO: Moves all extremities equally PSYCH: The patient's mood and manner are appropriate.  ____________________________________________   LABS (all labs ordered are listed, but only abnormal results are displayed)  Labs Reviewed  URINALYSIS, ROUTINE W REFLEX MICROSCOPIC - Abnormal; Notable for the following components:      Result Value   APPearance HAZY (*)    All other components within normal limits  CHLAMYDIA/NGC RT PCR ([facility] ONLY)            URINE CULTURE   ____________________________________________  EKG   ____________________________________________  RADIOLOGY I, Davit Vassar, personally viewed and evaluated these images (plain radiographs) as part of my medical decision making, as well as reviewing the written report by the radiologist.  ED MD interpretation: Ultrasound shows no torsion.  Official radiology report(s): US SCROTUM W/DOPPLER  Result Date: 09/23/2021 CLINICAL DATA:  Testicular pain EXAM: SCROTAL ULTRASOUND DOPPLER ULTRASOUND OF THE TESTICLES TECHNIQUE: Complete ultrasound examination of the testicles, epididymis, and other scrotal structures was performed. Color and spectral Doppler ultrasound were also utilized to evaluate blood flow to the testicles. COMPARISON:  02/26/2021 FINDINGS: Right testicle Measurements: 4.2 x 2.2 x 2.2 cm. No mass or microlithiasis visualized. Left testicle Measurements: 4.2 x 2.1 x 2.4 cm. No mass or microlithiasis visualized. Right epididymis:  Normal in size and appearance.  Left epididymis:  Normal in size and appearance. Hydrocele:  None visualized. Varicocele:  A small left varicocele is present Pulsed Doppler interrogation of both testes demonstrates normal low resistance arterial and venous waveforms bilaterally. IMPRESSION: Small left varicocele.  Otherwise unremarkable examination. Electronically Signed   By: Helyn Numbers M.D.   On: 09/23/2021 01:18    ____________________________________________   PROCEDURES  Procedure(s) performed (including Critical Care):  Procedures    ____________________________________________   INITIAL IMPRESSION / ASSESSMENT AND PLAN / ED COURSE  As part of my medical decision making, I reviewed the following data within the electronic MEDICAL RECORD NUMBER Nursing notes reviewed and incorporated, Labs reviewed , and Notes from prior ED visits         Patient here with complaints of scrotal pain after experimenting with sex toys with his girlfriend yesterday.  No sign of any swelling, bruising on exam.  No sign of any hernia.  No sign of scrotal abscess, cellulitis.  No testicular masses.  Ultrasound was obtained which shows no sign of torsion with normal blood flow to both testicles.  Urine does not appear infected today.  Gonorrhea and Chlamydia test are negative.  He does have a small left sided varicocele but discussed with patient that this is likely an incidental finding and I do not think is the cause of any of his discomfort.  No sign of penile injury Or fracture on exam.  Recommended Tylenol, ibuprofen over-the-counter as needed for pain.  Patient is comfortable with this plan.  Have advised caution using constrictive devices around his penis, scrotum in the future.  At this time, I do not feel there is any life-threatening condition present. I have reviewed, interpreted and discussed all results (EKG, imaging, lab, urine as appropriate) and exam findings with patient/family. I have reviewed nursing notes and appropriate  previous records.  I feel the patient is safe to be discharged home without further emergent workup and can continue workup as an outpatient as needed. Discussed usual and customary return precautions. Patient/family verbalize understanding and are comfortable with this plan.  Outpatient follow-up has been provided as needed. All questions have been answered.  ____________________________________________   FINAL CLINICAL IMPRESSION(S) / ED DIAGNOSES  Final diagnoses:  Scrotal pain  Left varicocele     ED Discharge Orders     None       *Please note:  Allen Day was evaluated in Emergency Department on 09/23/2021 for the symptoms described in the history of present illness. He was evaluated in the context of the global COVID-19 pandemic, which necessitated consideration that the patient might be at risk for infection with the SARS-CoV-2 virus that causes COVID-19. Institutional protocols and algorithms that pertain to the evaluation of patients at risk for COVID-19 are in a state of rapid change based on information released by regulatory bodies including the CDC and federal and state organizations. These policies and algorithms were followed during the patient's care in the ED.  Some ED evaluations and interventions may be delayed as a result of limited staffing during and the pandemic.*   Note:  This document was prepared using Dragon voice recognition software and may include unintentional dictation errors.    Persephone Schriever, Delice Bison, DO 09/23/21 [phone]

## 2021-09-24 LAB — URINE CULTURE: Culture: NO GROWTH

## 2021-11-04 ENCOUNTER — Encounter: Payer: Self-pay | Admitting: Emergency Medicine

## 2021-11-04 ENCOUNTER — Emergency Department: Payer: BC Managed Care – PPO

## 2021-11-04 ENCOUNTER — Emergency Department
Admission: EM | Admit: 2021-11-04 | Discharge: 2021-11-04 | Disposition: A | Discharge status: Home / Self Care | Payer: BC Managed Care – PPO | Attending: Emergency Medicine | Admitting: Emergency Medicine

## 2021-11-04 ENCOUNTER — Other Ambulatory Visit: Payer: Self-pay

## 2021-11-04 DIAGNOSIS — M25561 Pain in right knee: Secondary | ICD-10-CM | POA: Insufficient documentation

## 2021-11-04 DIAGNOSIS — Y9367 Activity, basketball: Secondary | ICD-10-CM | POA: Insufficient documentation

## 2021-11-04 DIAGNOSIS — X58XXXA Exposure to other specified factors, initial encounter: Secondary | ICD-10-CM | POA: Insufficient documentation

## 2021-11-04 MED ORDER — MELOXICAM 15 MG PO TABS: 15.0000 mg | ORAL_TABLET | Freq: Every day | ORAL | 2 refills | Status: DC

## 2021-11-04 MED ORDER — KETOROLAC TROMETHAMINE 30 MG/ML IJ SOLN
30.0000 mg | Freq: Once | INTRAMUSCULAR | Status: AC
  Administered 2021-11-04: 30 mg via INTRAMUSCULAR
  Filled 2021-11-04: qty 1

## 2021-11-04 NOTE — ED Provider Notes (Signed)
[facility] Provider Note  Patient Contact: 11:44 PM (approximate)   History   Knee Pain   HPI  Allen Day is a 26 y.o. male presents to the emergency department with acute right knee pain.  Patient states that the pain started after he was playing basketball tonight.  He states that he when he went to pivot he did feel a sharp pain.  Denies experiencing similar symptoms in the past.  Denies prior surgeries of the right knee.  States he has been able to ambulate since injury occurred.      Physical Exam   Triage Vital Signs: ED Triage Vitals  Enc Vitals Group     BP 11/04/21 2157 139/82     Pulse Rate 11/04/21 2157 81     Resp 11/04/21 2157 18     Temp 11/04/21 2157 99.1 F (37.3 C)     Temp Source 11/04/21 2157 Oral     SpO2 11/04/21 2157 97 %     Weight 11/04/21 2154 205 lb (93 kg)     Height 11/04/21 2154 6\' 3"  (1.905 m)     Head Circumference --      Peak Flow --      Pain Score 11/04/21 2154 8     Pain Loc --      Pain Edu? --      Excl. in GC? --     Most recent vital signs: Vitals:   11/04/21 2157  BP: 139/82  Pulse: 81  Resp: 18  Temp: 99.1 F (37.3 C)  SpO2: 97%     General: Alert and in no acute distress. Eyes:  PERRL. EOMI. Head: No acute traumatic findings ENT:      Ears:       Nose: No congestion/rhinnorhea.      Mouth/Throat: Mucous membranes are moist. Neck: No stridor. No cervical spine tenderness to palpation. Hematological/Lymphatic/Immunilogical: No cervical lymphadenopathy.  Cardiovascular:  Good peripheral perfusion Respiratory: Normal respiratory effort without tachypnea or retractions. Lungs CTAB. Good air entry to the bases with no decreased or absent breath sounds. Gastrointestinal: Bowel sounds 4 quadrants. Soft and nontender to palpation. No guarding or rigidity. No palpable masses. No distention. No CVA tenderness. Musculoskeletal: Full range of motion to all extremities.  Neurologic:  No gross  focal neurologic deficits are appreciated.  Skin:   No rash noted Other:   ED Results / Procedures / Treatments   Labs (all labs ordered are listed, but only abnormal results are displayed) Labs Reviewed - No data to display      RADIOLOGY  I personally viewed and evaluated these images as part of my medical decision making, as well as reviewing the written report by the radiologist.  ED Provider Interpretation: I personally reviewed x-ray of right knee and no bony abnormality was visualized.   PROCEDURES:  Critical Care performed: No  Procedures   MEDICATIONS ORDERED IN ED: Medications  ketorolac (TORADOL) 30 MG/ML injection 30 mg (has no administration in time range)     IMPRESSION / MDM / ASSESSMENT AND PLAN / ED COURSE  I reviewed the triage vital signs and the nursing notes.                              Assessment and Plan: Right knee pain:   Differential diagnosis includes, but is not limited to, ACL tear, PCL tear, meniscal tear, knee strain/sprain, fracture...  26 year old male  presents to the emergency department with acute right knee pain.  Vital signs are reassuring at triage.  On physical exam, patient was alert, active and nontoxic-appearing.  X-ray of the right knee showed no acute bony abnormality.  I personally reviewed x-ray of the right knee.  Patient was discharged with meloxicam and a work note was provided.     FINAL CLINICAL IMPRESSION(S) / ED DIAGNOSES   Final diagnoses:  Acute pain of right knee     Rx / DC Orders   ED Discharge Orders          Ordered    meloxicam (MOBIC) 15 MG tablet  Daily        11/04/21 2335             Note:  This document was prepared using Dragon voice recognition software and may include unintentional dictation errors.   Pia Mau Wedgewood, PA-C 11/04/21 2348    Sharman Cheek, MD 11/08/21 [phone]

## 2022-03-06 ENCOUNTER — Emergency Department
Admission: EM | Admit: 2022-03-06 | Discharge: 2022-03-06 | Disposition: A | Discharge status: Home / Self Care | Payer: BC Managed Care – PPO | Attending: Student in an Organized Health Care Education/Training Program | Admitting: Student in an Organized Health Care Education/Training Program

## 2022-03-06 ENCOUNTER — Encounter: Payer: Self-pay | Admitting: Emergency Medicine

## 2022-03-06 ENCOUNTER — Other Ambulatory Visit: Payer: Self-pay

## 2022-03-06 DIAGNOSIS — S76911A Strain of unspecified muscles, fascia and tendons at thigh level, right thigh, initial encounter: Secondary | ICD-10-CM | POA: Insufficient documentation

## 2022-03-06 DIAGNOSIS — S79921A Unspecified injury of right thigh, initial encounter: Secondary | ICD-10-CM | POA: Diagnosis present

## 2022-03-06 DIAGNOSIS — X58XXXA Exposure to other specified factors, initial encounter: Secondary | ICD-10-CM | POA: Insufficient documentation

## 2022-03-06 DIAGNOSIS — Y9367 Activity, basketball: Secondary | ICD-10-CM | POA: Insufficient documentation

## 2022-03-06 DIAGNOSIS — S76311A Strain of muscle, fascia and tendon of the posterior muscle group at thigh level, right thigh, initial encounter: Secondary | ICD-10-CM

## 2022-03-06 MED ORDER — MELOXICAM 7.5 MG PO TABS
15.0000 mg | ORAL_TABLET | Freq: Once | ORAL | Status: AC
  Administered 2022-03-06: 15 mg via ORAL
  Filled 2022-03-06: qty 2

## 2022-03-06 MED ORDER — MELOXICAM 15 MG PO TABS: 15.0000 mg | ORAL_TABLET | Freq: Every day | ORAL | 0 refills | Status: AC

## 2022-03-06 NOTE — ED Triage Notes (Signed)
Pt via POV from home. Pt c/o R upper leg pain after playing basketball yesterday. Pt states of the posterior aspect of his thigh right above his knee. States that it hurts more when he has to bend down or moving. Denies any actual injuries or falls. Pt is A&OX4 and NAD

## 2022-03-06 NOTE — ED Provider Notes (Signed)
[facility] Provider Note  Patient Contact: 8:26 PM (approximate)   History   Leg Pain   HPI  Allen Day is a 26 y.o. male who presents the emergency department complaining of right posterior thigh pain.  Patient states that he was playing basketball, went up for a dunk and when he landed he felt a pull in his hamstring region.  Patient was able to continue playing though he did feel that it limited his play somewhat.  Patient is having some ongoing tightness in the back of his hamstring.  He denies any bruising, deformity.  He is still able to ambulate at this time.  No other injury or complaint at this time.     Physical Exam   Triage Vital Signs: ED Triage Vitals  Enc Vitals Group     BP 03/06/22 1801 (!) 124/94     Pulse Rate 03/06/22 1801 77     Resp 03/06/22 1801 20     Temp 03/06/22 1801 98.5 F (36.9 C)     Temp Source 03/06/22 1801 Oral     SpO2 03/06/22 1801 96 %     Weight 03/06/22 1758 205 lb (93 kg)     Height 03/06/22 1758 6\' 2"  (1.88 m)     Head Circumference --      Peak Flow --      Pain Score 03/06/22 1758 0     Pain Loc --      Pain Edu? --      Excl. in GC? --     Most recent vital signs: Vitals:   03/06/22 1801  BP: (!) 124/94  Pulse: 77  Resp: 20  Temp: 98.5 F (36.9 C)  SpO2: 96%     General: Alert and in no acute distress.  Cardiovascular:  Good peripheral perfusion Respiratory: Normal respiratory effort without tachypnea or retractions. Lungs CTAB.  Musculoskeletal: Full range of motion to all extremities.  Tenderness along the distal hamstring without palpable abnormality.  Full range of motion to the knee and the hip.  No tenderness over the knee or hip.  No ecchymosis or edema noted to the posterior thigh. Neurologic:  No gross focal neurologic deficits are appreciated.  Skin:   No rash noted Other:   ED Results / Procedures / Treatments   Labs (all labs ordered are listed, but only abnormal results  are displayed) Labs Reviewed - No data to display   EKG     RADIOLOGY    No results found.  PROCEDURES:  Critical Care performed: No  Procedures   MEDICATIONS ORDERED IN ED: Medications  meloxicam (MOBIC) tablet 15 mg (has no administration in time range)     IMPRESSION / MDM / ASSESSMENT AND PLAN / ED COURSE  I reviewed the triage vital signs and the nursing notes.                              Differential diagnosis includes, but is not limited to, hamstring strain, hamstring tear, knee injury, stress fracture  Patient's presentation is most consistent with acute illness / injury with system symptoms.   Patient's diagnosis is consistent with hamstring strain.  Patient presents emergency department with pain to the right leg after landing awkwardly while dunking.  No concerning findings such as edema, deficit along the hamstring, ecchymosis.  Patient will be placed on an anti-inflammatory for symptom improvement.  Follow-up with orthopedics if symptoms  or not improving..  Patient is given ED precautions to return to the ED for any worsening or new symptoms.        FINAL CLINICAL IMPRESSION(S) / ED DIAGNOSES   Final diagnoses:  Strain of right hamstring, initial encounter     Rx / DC Orders   ED Discharge Orders          Ordered    meloxicam (MOBIC) 15 MG tablet  Daily        03/06/22 2035             Note:  This document was prepared using Dragon voice recognition software and may include unintentional dictation errors.   Lanette Hampshire 03/06/22 2035    Willy Eddy, MD 03/06/22 2237

## 2023-01-29 IMAGING — DX DG CHEST 1V PORT
1 series · 1 of 1 positions shown · non-contrast
Comparison: October 30, 2009

CLINICAL DATA: Hypoxia and tachypnea. Reported recent hernia repair

EXAM:
PORTABLE CHEST 1 VIEW

[chest ap]
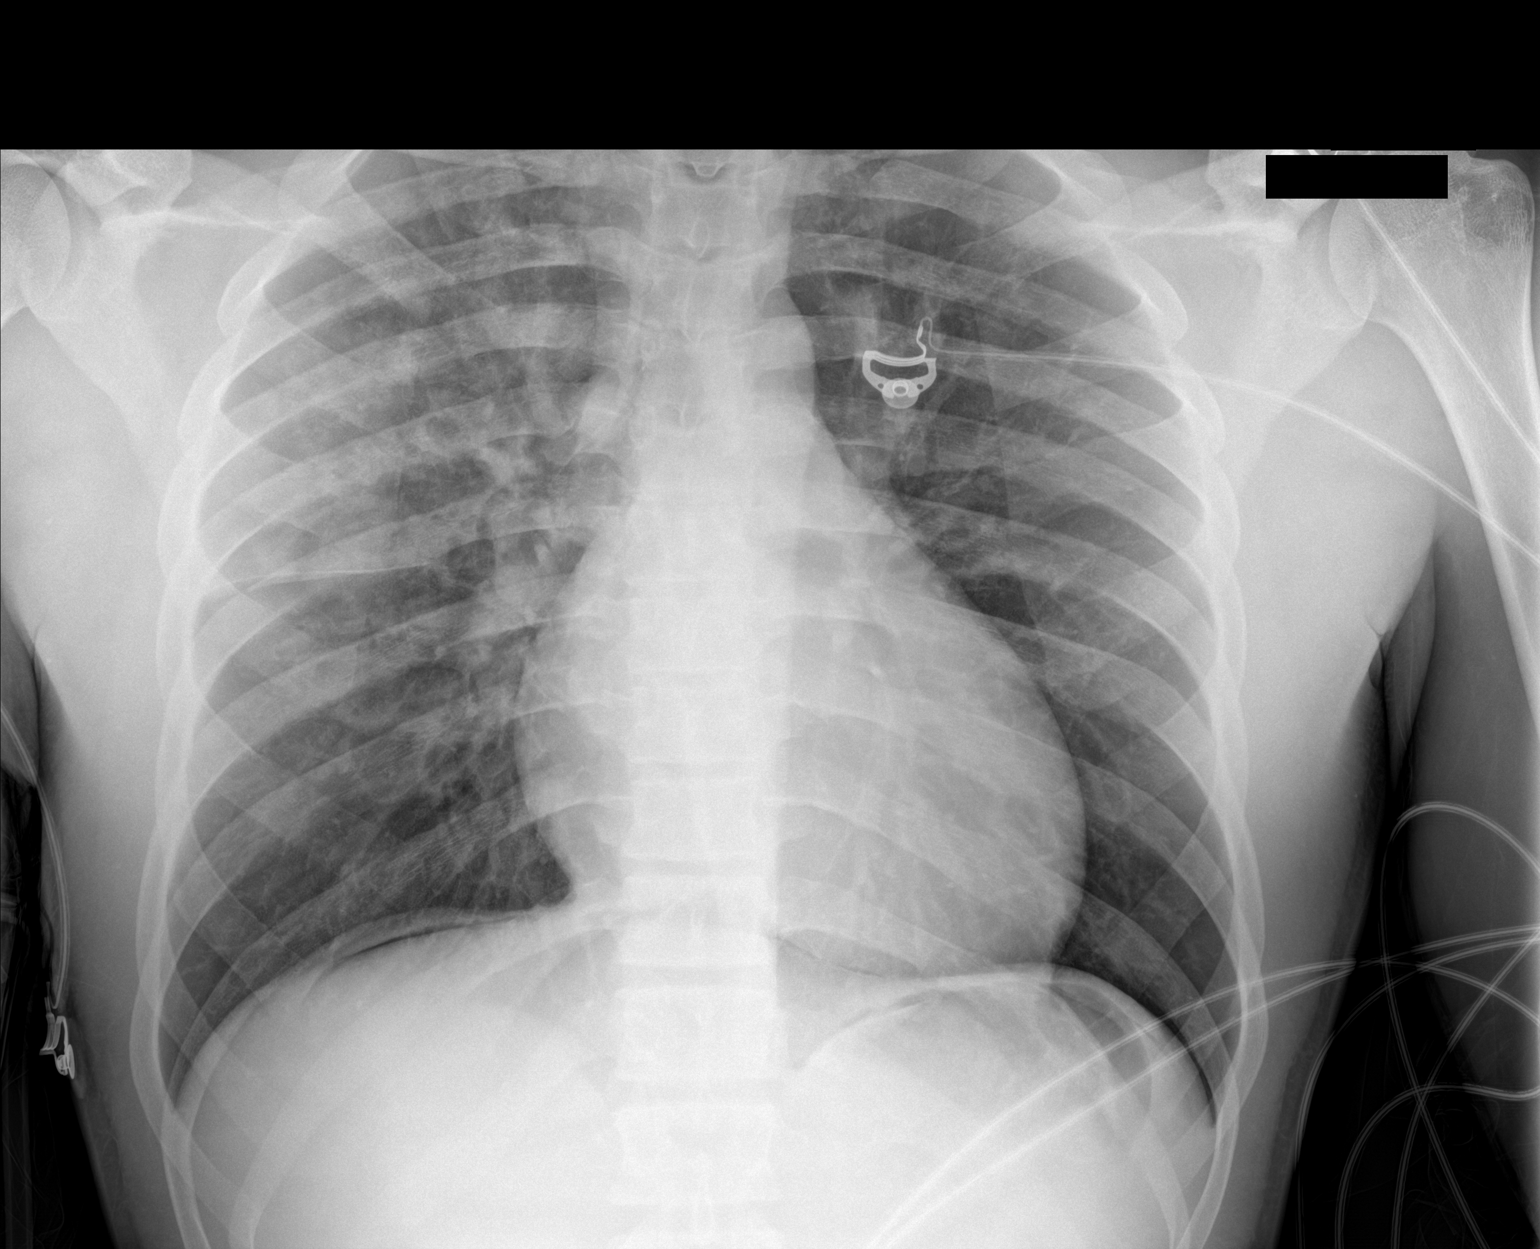

[1 of 1 positions shown; findings below may reference images not displayed]

FINDINGS: Airspace opacity noted in the right upper lobe. Lungs elsewhere
clear. Heart size and pulmonary vascularity are normal. No
adenopathy.

Mild pneumoperitoneum noted.
IMPRESSION: 1. Airspace opacity consistent with pneumonia right upper lobe.
Aspiration is a differential consideration.

2. Mild pneumoperitoneum, likely due to recent surgery. This finding
should be monitored closely clinically and by imaging given
potential for viscus perforation to present with pneumoperitoneum.

Cardiac silhouette within normal limits.

These results will be called to the ordering clinician or
representative by the Radiologist Assistant, and communication
documented in the PACS or [REDACTED].

## 2024-01-22 IMAGING — DX DG KNEE COMPLETE 4+V*R*
4 series · 4 of 4 positions shown · non-contrast
Comparison: None.

CLINICAL DATA: Right knee pain

EXAM:
RIGHT KNEE - COMPLETE 4+ VIEW

[knee ap]
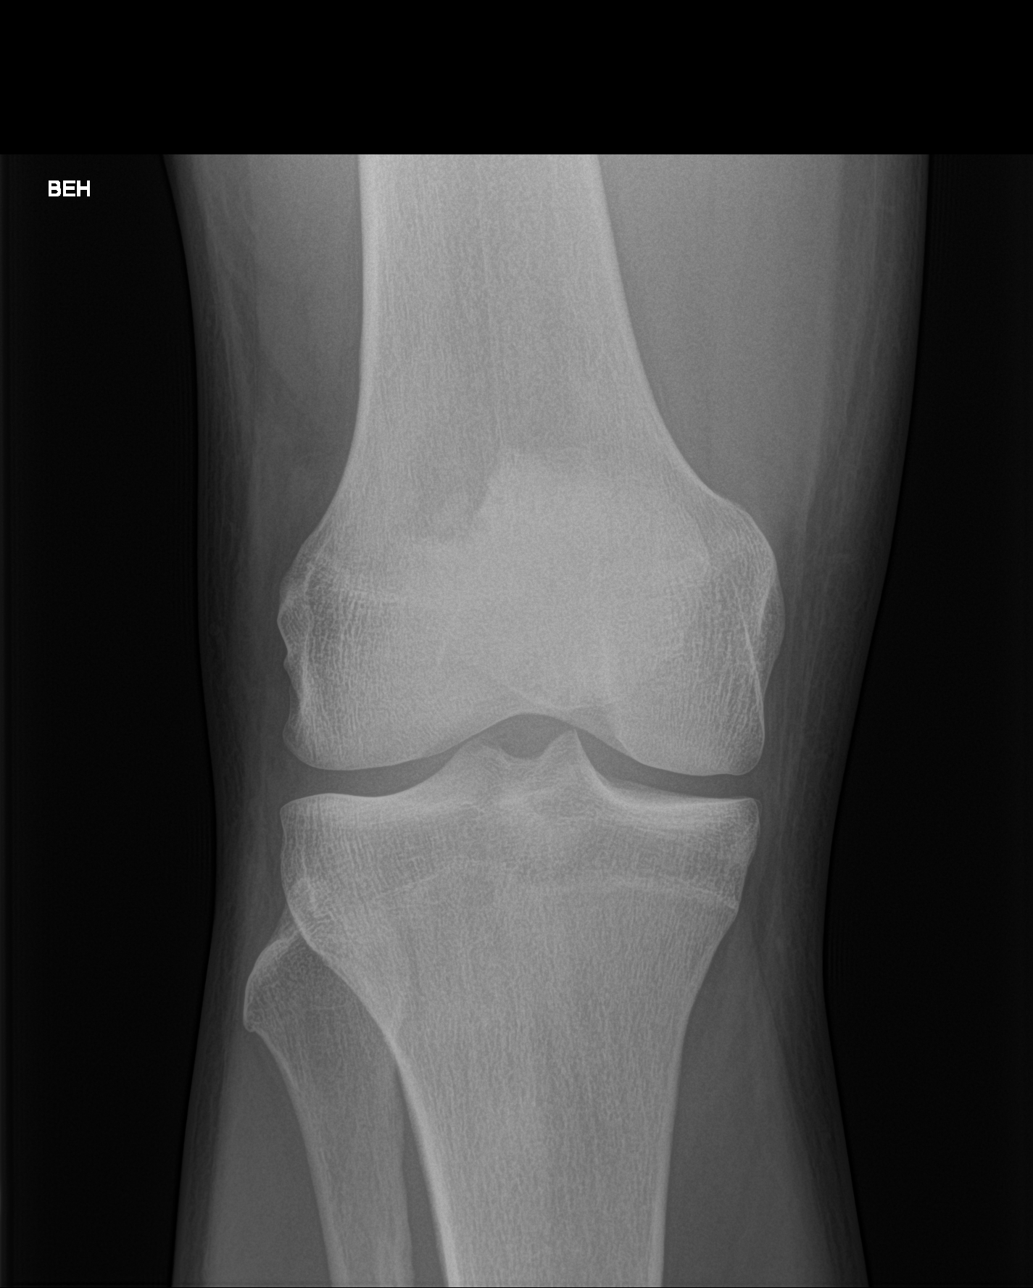

[knee tunnel]
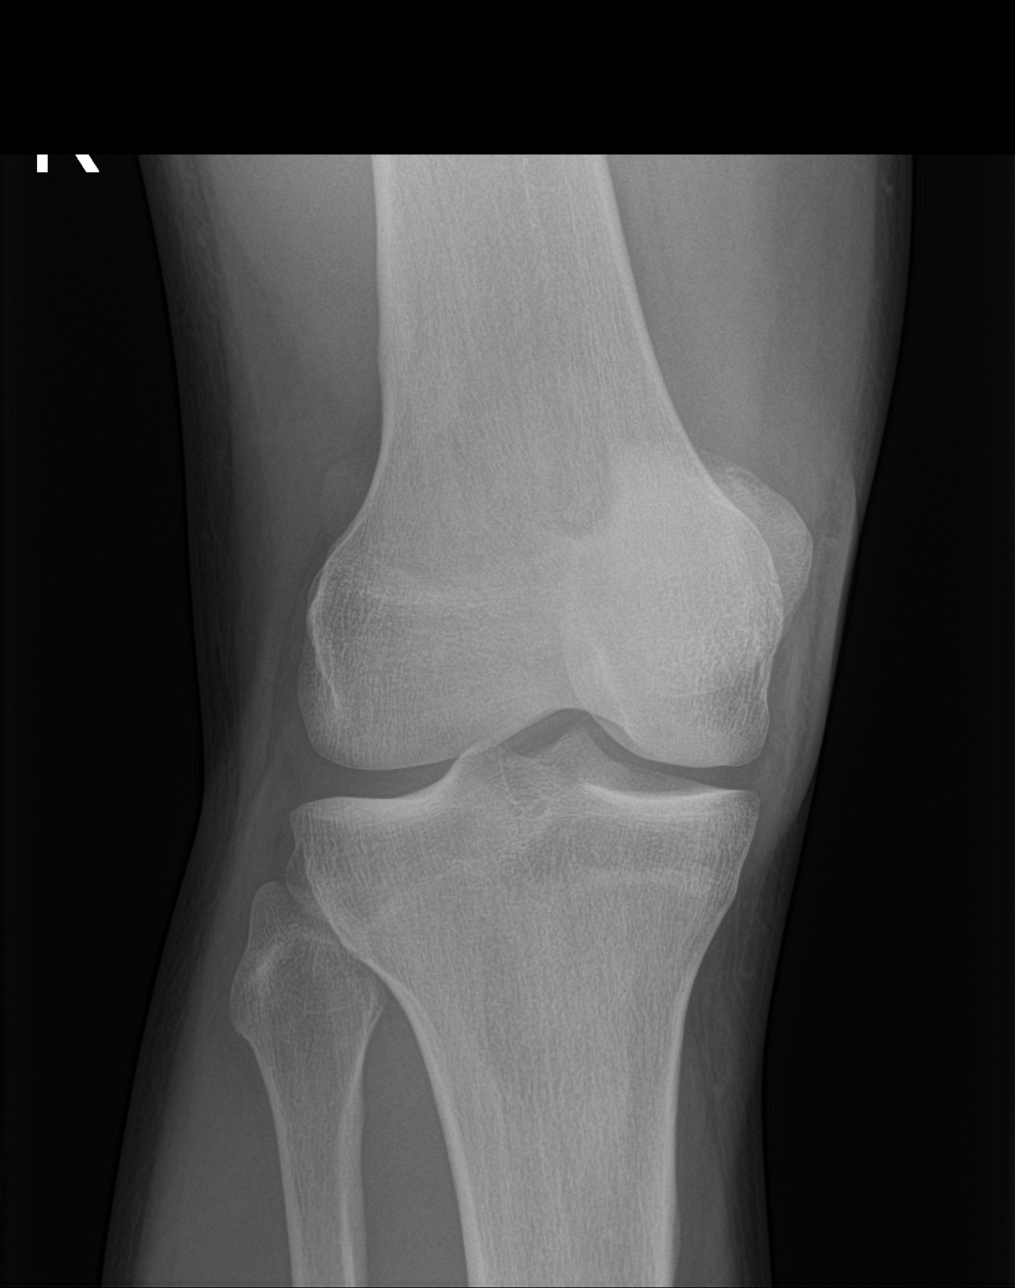

[knee lat]
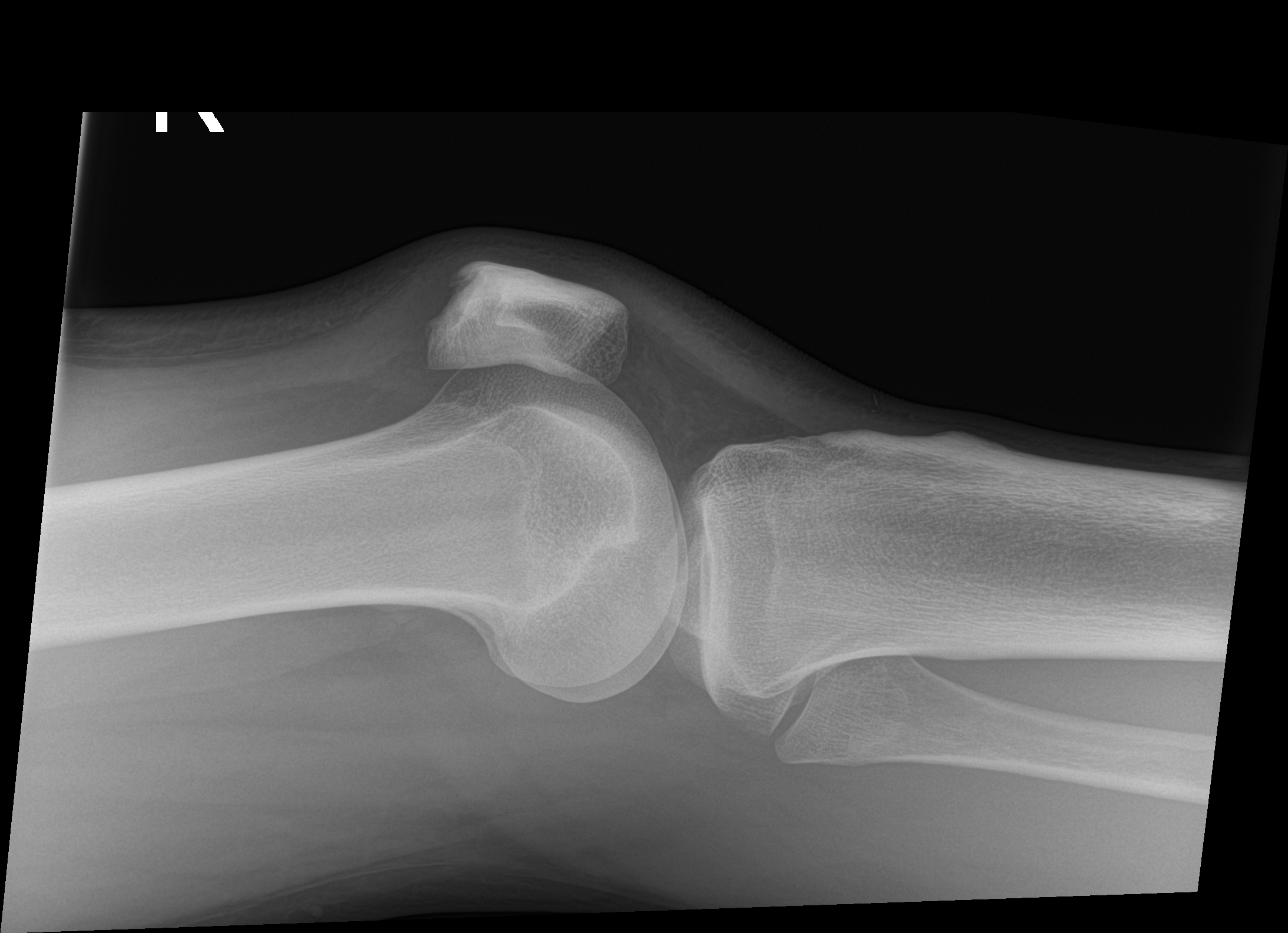

[knee obl]
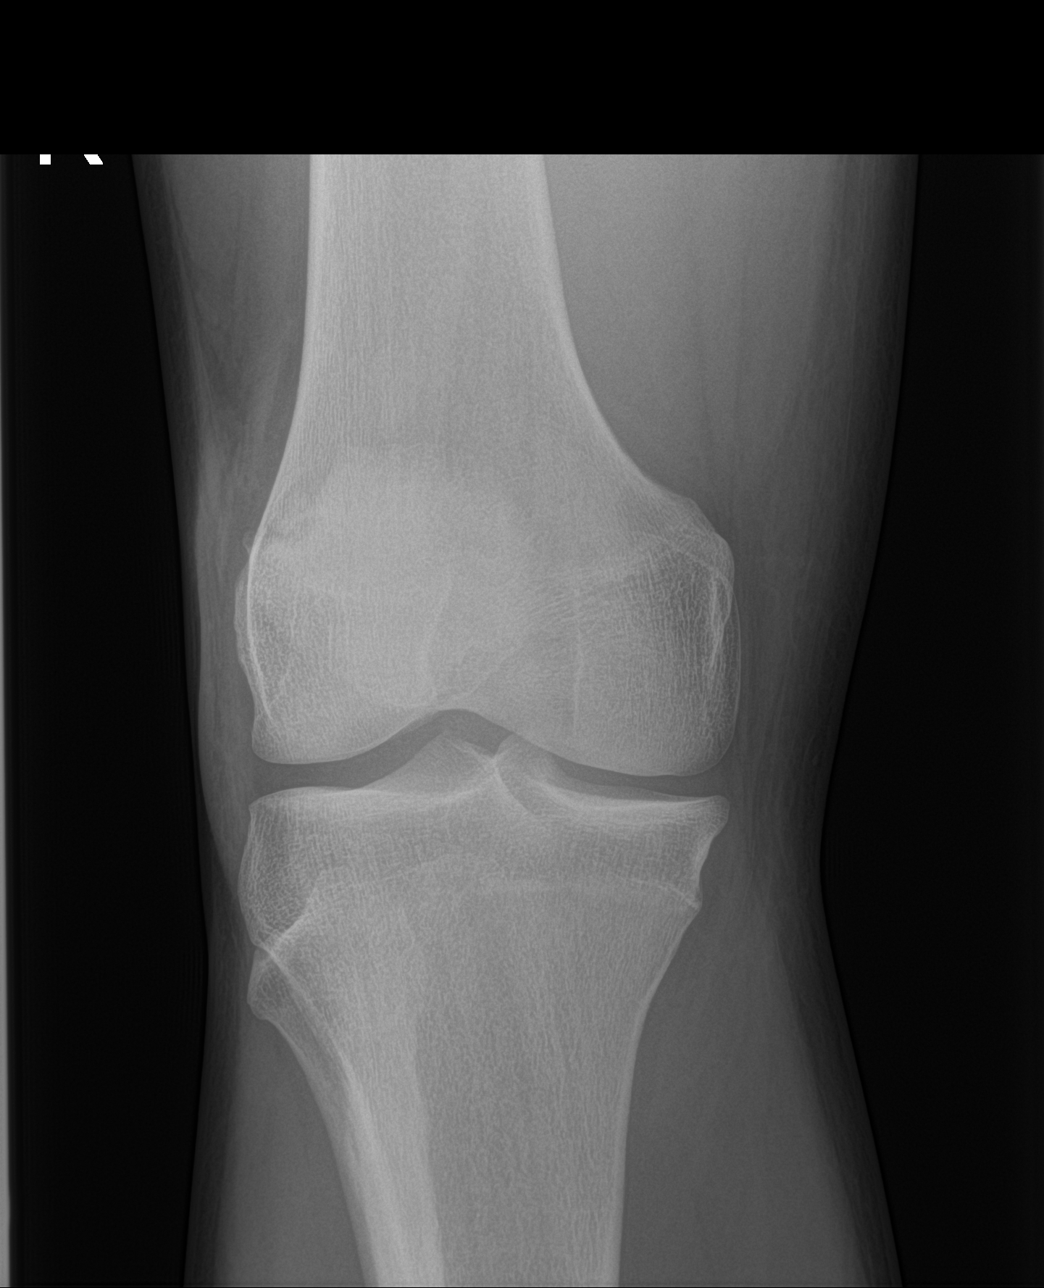

[4 of 4 positions shown; findings below may reference images not displayed]

FINDINGS: No evidence of fracture, dislocation, or joint effusion. No evidence
of arthropathy or other focal bone abnormality. Soft tissues are
unremarkable.
IMPRESSION: Negative.
